# Patient Record
Sex: Male | Born: 1965
Health system: Southern US, Community
[De-identification: ages and names within clinical notes are randomized; demographics above are authoritative.]

## PROBLEM LIST (undated history)

## (undated) DIAGNOSIS — M109 Gout, unspecified: Secondary | ICD-10-CM

## (undated) DIAGNOSIS — I10 Essential (primary) hypertension: Secondary | ICD-10-CM

## (undated) HISTORY — PX: KNEE SURGERY: SHX244

## (undated) HISTORY — DX: Gout, unspecified: M10.9

## (undated) HISTORY — DX: Essential (primary) hypertension: I10

---

## 2018-10-04 ENCOUNTER — Encounter: Payer: Self-pay | Admitting: Family Medicine

## 2018-10-04 ENCOUNTER — Ambulatory Visit (INDEPENDENT_AMBULATORY_CARE_PROVIDER_SITE_OTHER): Payer: Managed Care, Other (non HMO) | Admitting: Family Medicine

## 2018-10-04 ENCOUNTER — Ambulatory Visit: Payer: Self-pay | Admitting: Physician Assistant

## 2018-10-04 VITALS — BP 200/110 | HR 75 | Temp 98.1°F | Ht 71.0 in | Wt 194.4 lb

## 2018-10-04 DIAGNOSIS — Z8249 Family history of ischemic heart disease and other diseases of the circulatory system: Secondary | ICD-10-CM | POA: Insufficient documentation

## 2018-10-04 DIAGNOSIS — I10 Essential (primary) hypertension: Secondary | ICD-10-CM | POA: Diagnosis not present

## 2018-10-04 LAB — TSH: TSH: 1.56 u[IU]/mL (ref 0.35–4.50)

## 2018-10-04 LAB — CBC
HCT: 49.5 % (ref 39.0–52.0)
Hemoglobin: 17 g/dL (ref 13.0–17.0)
MCHC: 34.3 g/dL (ref 30.0–36.0)
MCV: 90.6 fl (ref 78.0–100.0)
Platelets: 208 10*3/uL (ref 150.0–400.0)
RBC: 5.46 Mil/uL (ref 4.22–5.81)
RDW: 12.8 % (ref 11.5–15.5)
WBC: 6.5 10*3/uL (ref 4.0–10.5)

## 2018-10-04 LAB — COMPREHENSIVE METABOLIC PANEL
ALT: 18 U/L (ref 0–53)
AST: 18 U/L (ref 0–37)
Albumin: 4.5 g/dL (ref 3.5–5.2)
Alkaline Phosphatase: 51 U/L (ref 39–117)
BUN: 14 mg/dL (ref 6–23)
CHLORIDE: 101 meq/L (ref 96–112)
CO2: 28 mEq/L (ref 19–32)
Calcium: 9.7 mg/dL (ref 8.4–10.5)
Creatinine, Ser: 1.27 mg/dL (ref 0.40–1.50)
GFR: 62.68 mL/min (ref >60.00)
GLUCOSE: 90 mg/dL (ref 70–99)
Potassium: 4.4 mEq/L (ref 3.5–5.1)
Sodium: 137 mEq/L (ref 135–145)
Total Bilirubin: 1.2 mg/dL (ref 0.2–1.2)
Total Protein: 7.1 g/dL (ref 6.0–8.3)

## 2018-10-04 MED ORDER — AMLODIPINE BESYLATE 10 MG PO TABS: 10.0000 mg | ORAL_TABLET | Freq: Every day | ORAL | 3 refills | Status: DC

## 2018-10-04 NOTE — Patient Instructions (Signed)
It was very nice to see you today!  Please start the amlodipine.  Please take 1 pill once daily.  Please keep an eye on your blood pressure over the next week or so and let me know if it is persistently elevated.  Let me know if you have any side effects.  We will place referral to a cardiologist today.  I would like for you to come back and see us within the next week or so to recheck your blood pressure if you have not seen the cardiologist.  We will check blood work today.  Please seek medical care if you start having any symptoms including chest pain, shortness of breath, headache, blurred vision, weakness, or numbness.  Take care, Dr Jimmey RalphParker   [facility] DASH stands for "Dietary Approaches to Stop Hypertension." The DASH eating plan is a healthy eating plan that has been shown to reduce high blood pressure (hypertension). It may also reduce your risk for type 2 diabetes, heart disease, and stroke. The DASH eating plan may also help with weight loss. What are tips for following this plan?  General guidelines  Avoid eating more than 2,300 mg (milligrams) of salt (sodium) a day. If you have hypertension, you may need to reduce your sodium intake to 1,500 mg a day.  Limit alcohol intake to no more than 1 drink a day for nonpregnant women and 2 drinks a day for men. One drink equals 12 oz of beer, 5 oz of wine, or 1 oz of hard liquor.  Work with your health care provider to maintain a healthy body weight or to lose weight. Ask what an ideal weight is for you.  Get at least 30 minutes of exercise that causes your heart to beat faster (aerobic exercise) most days of the week. Activities may include walking, swimming, or biking.  Work with your health care provider or diet and nutrition specialist (dietitian) to adjust your eating plan to your individual calorie needs. Reading food labels   Check food labels for the amount of sodium per serving. Choose foods with less than 5  percent of the Daily Value of sodium. Generally, foods with less than 300 mg of sodium per serving fit into this eating plan.  To find whole grains, look for the word "whole" as the first word in the ingredient list. Shopping  Buy products labeled as "low-sodium" or "no salt added."  Buy fresh foods. Avoid canned foods and premade or frozen meals. Cooking  Avoid adding salt when cooking. Use salt-free seasonings or herbs instead of table salt or sea salt. Check with your health care provider or pharmacist before using salt substitutes.  Do not fry foods. Cook foods using healthy methods such as baking, boiling, grilling, and broiling instead.  Cook with heart-healthy oils, such as olive, canola, soybean, or sunflower oil. Meal planning  Eat a balanced diet that includes: ? 5 or more servings of fruits and vegetables each day. At each meal, try to fill half of your plate with fruits and vegetables. ? Up to 6-8 servings of whole grains each day. ? Less than 6 oz of lean meat, poultry, or fish each day. A 3-oz serving of meat is about the same size as a deck of cards. One egg equals 1 oz. ? 2 servings of low-fat dairy each day. ? A serving of nuts, seeds, or beans 5 times each week. ? Heart-healthy fats. Healthy fats called Omega-3 fatty acids are found in foods such as  flaxseeds and coldwater fish, like sardines, salmon, and mackerel.  Limit how much you eat of the following: ? Canned or prepackaged foods. ? Food that is high in trans fat, such as fried foods. ? Food that is high in saturated fat, such as fatty meat. ? Sweets, desserts, sugary drinks, and other foods with added sugar. ? Full-fat dairy products.  Do not salt foods before eating.  Try to eat at least 2 vegetarian meals each week.  Eat more home-cooked food and less restaurant, buffet, and fast food.  When eating at a restaurant, ask that your food be prepared with less salt or no salt, if possible. What foods are  recommended? The items listed may not be a complete list. Talk with your dietitian about what dietary choices are best for you. Grains Whole-grain or whole-wheat bread. Whole-grain or whole-wheat pasta. Brown rice. Orpah Cobbatmeal. Quinoa. Bulgur. Whole-grain and low-sodium cereals. Pita bread. Low-fat, low-sodium crackers. Whole-wheat flour tortillas. Vegetables Fresh or frozen vegetables (raw, steamed, roasted, or grilled). Low-sodium or reduced-sodium tomato and vegetable juice. Low-sodium or reduced-sodium tomato sauce and tomato paste. Low-sodium or reduced-sodium canned vegetables. Fruits All fresh, dried, or frozen fruit. Canned fruit in natural juice (without added sugar). Meat and other protein foods Skinless chicken or Malawiturkey. Ground chicken or Mal[place]iturkey. Pork with fat trimmed off. Fish and seafood. Egg whites. Dried beans, peas, or lentils. Unsalted nuts, nut butters, and seeds. Unsalted canned beans. Lean cuts of beef with fat trimmed off. Low-sodium, lean deli meat. Dairy Low-fat (1%) or fat-free (skim) milk. Fat-free, low-fat, or reduced-fat cheeses. Nonfat, low-sodium ricotta or cottage cheese. Low-fat or nonfat yogurt. Low-fat, low-sodium cheese. Fats and oils Soft margarine without trans fats. Vegetable oil. Low-fat, reduced-fat, or light mayonnaise and salad dressings (reduced-sodium). Canola, safflower, olive, soybean, and sunflower oils. Avocado. Seasoning and other foods Herbs. Spices. Seasoning mixes without salt. Unsalted popcorn and pretzels. Fat-free sweets. What foods are not recommended? The items listed may not be a complete list. Talk with your dietitian about what dietary choices are best for you. Grains Baked goods made with fat, such as croissants, muffins, or some breads. Dry pasta or rice meal packs. Vegetables Creamed or fried vegetables. Vegetables in a cheese sauce. Regular canned vegetables (not low-sodium or reduced-sodium). Regular canned tomato sauce and paste (not  low-sodium or reduced-sodium). Regular tomato and vegetable juice (not low-sodium or reduced-sodium). Rosita FirePickles. Olives. Fruits Canned fruit in a light or heavy syrup. Fried fruit. Fruit in cream or butter sauce. Meat and other protein foods Fatty cuts of meat. Ribs. Fried meat. Tomasa BlaseBacon. Sausage. Bologna and other processed lunch meats. Salami. Fatback. Hotdogs. Bratwurst. Salted nuts and seeds. Canned beans with added salt. Canned or smoked fish. Whole eggs or egg yolks. Chicken or Malawiturkey with skin. Dairy Whole or 2% milk, cream, and half-and-half. Whole or full-fat cream cheese. Whole-fat or sweetened yogurt. Full-fat cheese. Nondairy creamers. Whipped toppings. Processed cheese and cheese spreads. Fats and oils Butter. Stick margarine. Lard. Shortening. Ghee. Bacon fat. Tropical oils, such as coconut, palm kernel, or palm oil. Seasoning and other foods Salted popcorn and pretzels. Onion salt, garlic salt, seasoned salt, table salt, and sea salt. Worcestershire sauce. Tartar sauce. Barbecue sauce. Teriyaki sauce. Soy sauce, including reduced-sodium. Steak sauce. Canned and packaged gravies. Fish sauce. Oyster sauce. Cocktail sauce. Horseradish that you find on the shelf. Ketchup. Mustard. Meat flavorings and tenderizers. Bouillon cubes. Hot sauce and Tabasco sauce. Premade or packaged marinades. Premade or packaged taco seasonings. Relishes. Regular salad dressings. Where  to find more information:  National Heart, Lung, and Blood Institute: PopSteam.is  American Heart Association: www.heart.org Summary  The DASH eating plan is a healthy eating plan that has been shown to reduce high blood pressure (hypertension). It may also reduce your risk for type 2 diabetes, heart disease, and stroke.  With the DASH eating plan, you should limit salt (sodium) intake to 2,300 mg a day. If you have hypertension, you may need to reduce your sodium intake to 1,500 mg a day.  When on the DASH eating plan,  aim to eat more fresh fruits and vegetables, whole grains, lean proteins, low-fat dairy, and heart-healthy fats.  Work with your health care provider or diet and nutrition specialist (dietitian) to adjust your eating plan to your individual calorie needs. This information is not intended to replace advice given to you by your health care provider. Make sure you discuss any questions you have with your health care provider. Document Released: 08/26/2011 Document Revised: 08/30/2016 Document Reviewed: 08/30/2016 Elsevier Interactive Patient Education  2019 [employer].

## 2018-10-04 NOTE — Progress Notes (Signed)
ekg 

## 2018-10-04 NOTE — Progress Notes (Signed)
Cardiology Office Note   Date:  10/05/2018   ID:  Lee Henderson, DOB 10-23-1965, MRN [id number]  PCP:  Ardith Dark, MD  Cardiologist:   No primary care provider on file. Referring:  Ardith Dark, MD  No chief complaint on file.     History of Present Illness: Lee Henderson is a 53 y.o. male who is referred by Ardith Dark, MD for evaluation of a strong family history of CAD and new HTN.  The patient has had no recent cardiovascular testing.  He said about 20 years ago he had treadmill testing.  He does however have a family history as described below.  He wanted to get in for primary evaluation and did yesterday and was found to be significantly hypertensive with a blood pressure of 200/110.  He did not know that he had this degree of hypertension.  He was started on Norvasc.  His blood pressure is better today.  He was not having symptoms.  He has been trying to exercise tries to do some hiking with full pack in preparation for a camping trip he is taking with the [employer].  The patient denies any new symptoms such as chest discomfort, neck or arm discomfort.  There have been no reported palpitations, presyncope or syncope.   He does get short of breath with activities but is not describing PND or orthopnea.  Past Medical History:  Diagnosis Date  . Gout   . HTN (hypertension)     Past Surgical History:  Procedure Laterality Date  . KNEE SURGERY       Current Outpatient Medications  Medication Sig Dispense Refill  . amLODipine (NORVASC) 10 MG tablet Take 1 tablet (10 mg total) by mouth daily. 90 tablet 3   No current facility-administered medications for this visit.     Allergies:   Patient has no known allergies.    Social History:  The patient  reports that he has never smoked. He has never used smokeless tobacco. He reports current alcohol use. He reports that he does not use drugs.   Family History:  The patient's family history includes Alcohol abuse in his  paternal grandfather; Breast cancer in his mother; Heart attack in his paternal grandfather and paternal uncle; Heart disease in his paternal uncle; Heart disease (age of onset: 65) in his father; Hypertension in his father.    ROS:  Please see the history of present illness.   Otherwise, review of systems are positive for none.   All other systems are reviewed and negative.    PHYSICAL EXAM: VS:  BP (!) 168/98   Pulse 79   Ht 5\' 11"  (1.803 m)   Wt 193 lb 9.6 oz (87.8 kg)   BMI 27.00 kg/m  , BMI Body mass index is 27 kg/m. GENERAL:  Well appearing HEENT:  Pupils equal round and reactive, fundi not visualized, oral mucosa unremarkable NECK:  No jugular venous distention, waveform within normal limits, carotid upstroke brisk and symmetric, no bruits, no thyromegaly LYMPHATICS:  No cervical, inguinal adenopathy LUNGS:  Clear to auscultation bilaterally BACK:  No CVA tenderness CHEST:  Unremarkable HEART:  PMI not displaced or sustained,S1 and S2 within normal limits, no S3, no S4, no clicks, no rubs, no murmurs ABD:  Flat, positive bowel sounds normal in frequency in pitch, no bruits, no rebound, no guarding, no midline pulsatile mass, no hepatomegaly, no splenomegaly EXT:  2 plus pulses throughout, no edema, no cyanosis no clubbing SKIN:  No rashes no nodules NEURO:  Cranial nerves II through XII grossly intact, motor grossly intact throughout PSYCH:  Cognitively intact, oriented to person place and time    EKG:  EKG is not ordered today. The ekg ordered 10/05/18 demonstrates sinus rhythm, rate 72, axis within normal limits, intervals within normal limits, early repolarization pattern.   Recent Labs: 10/04/2018: ALT 18; BUN 14; Creatinine, Ser 1.27; Hemoglobin 17.0; Platelets 208.0; Potassium 4.4; Sodium 137; TSH 1.56    Lipid Panel No results found for: CHOL, TRIG, HDL, CHOLHDL, VLDL, LDLCALC, LDLDIRECT    Wt Readings from Last 3 Encounters:  10/05/18 193 lb 9.6 oz (87.8 kg)   10/04/18 194 lb 6.1 oz (88.2 kg)      Other studies Reviewed: Additional studies/ records that were reviewed today include: Office records. Review of the above records demonstrates:  Please see elsewhere in the note.     ASSESSMENT AND PLAN:  DOE: The patient has significant cardiovascular risk factors.  There is some dyspnea on exertion.  The pretest probability of obstructive coronary disease is low moderate.  I will bring the patient back for a POET (Plain Old Exercise Test). This will allow me to screen for obstructive coronary disease, risk stratify and very importantly provide a prescription for exercise.  However, I would not be able to do this until his blood pressure little bit better controlled.  He will keep an eye on this and let me know and we can schedule this.  I would also like to screen him with a coronary calcium score.  FAMILY HISTORY OF CAD: We are going to pursue aggressive risk reduction.  He needs a fasting lipid profile when he comes back.  We talked about lifestyle and he employs most of this.  We talked in particular about diet.  HTN: We will keep a blood pressure diary twice a day and I be happy to review these.  I agree with the change in medications.  Further changes will be based on these readings.    Current medicines are reviewed at length with the patient today.  The patient does not have concerns regarding medicines.  The following changes have been made:  no change  Labs/ tests ordered today include:   Orders Placed This Encounter  Procedures  . CT CARDIAC SCORING  . EXERCISE TOLERANCE TEST (ETT)     Disposition:   FU with me as needed based on the results of the above.     Signed, Rollene Rotunda, MD  10/05/2018 12:52 PM    [facility]

## 2018-10-04 NOTE — Assessment & Plan Note (Addendum)
Significantly elevated today however has no signs of endorgan damage.  EKG with normal sinus rhythm and no ischemic changes.  Otherwise asymptomatic.  Given his lack of symptoms and lack of signs of endorgan damage, do not think that he needs to go to the emergency department at this point.  We will start amlodipine 10 mg daily.  He will check his blood pressure over the next several days and let us know if it is not responding appropriately.  He will follow-up with [place] in the next week or so.  Check CBC, CMET, and TSH.  Patient is also very concerned about potential cardiac disease given his strong family history.  Will place referral to cardiology for risk stratification.

## 2018-10-04 NOTE — Progress Notes (Signed)
Subjective:  Lee Henderson is a 53 y.o. male who presents today with a chief complaint of hypertension and to establish care  HPI:  HTN, new problem Patient presents today with elevated blood pressure readings.  Over the last week or so has had a few outside blood pressure readings into the 200s over 100s.  He has taken these at automated kiosks at [employer] and [address].  It has been almost 20 years since he has seen a primary care physician.  He has been to urgent care a few times over the last several years and has never been told that he has high blood pressure in the past.  Denies any symptoms.  No headaches.  No blurred vision.  No weakness or numbness.  No chest pain or shortness of breath.  He has had some dietary indiscretions recently related to the holidays including increased salt intake which she thinks could have possibly contributed.  No medication changes.  He does not take any over-the-counter analgesics.  He has a strong family history of hypertension and ischemic heart disease.  Patient is concerned about possible ischemic heart disease for himself.  He has not tried any home remedies for his blood pressure.  No other obvious alleviating or aggravating factors.  Patient also has a history of diet-controlled gout.  He is not currently on any medications for this.  Has not had a flare in several years.  ROS: Per HPI, otherwise a complete review of systems was negative.   PMH:  The following were reviewed and entered/updated in epic: Past Medical History:  Diagnosis Date  . Gout    Patient Active Problem List   Diagnosis Date Noted  . Hypertension 10/04/2018  . Family history of chronic ischemic heart disease 10/04/2018   History reviewed. No pertinent surgical history.  Family History  Problem Relation Age of Onset  . Breast cancer Mother   . Heart disease Father   . Hypertension Father   . Alcohol abuse Paternal Grandfather   . Heart disease Paternal Uncle      Medications- reviewed and updated Current Outpatient Medications  Medication Sig Dispense Refill  . amLODipine (NORVASC) 10 MG tablet Take 1 tablet (10 mg total) by mouth daily. 90 tablet 3   No current facility-administered medications for this visit.     Allergies-reviewed and updated No Known Allergies  Social History   Socioeconomic History  . Marital status: Married    Spouse name: Not on file  . Number of children: Not on file  . Years of education: Not on file  . Highest education level: Not on file  Occupational History  . Not on file  Social Needs  . Financial resource strain: Not on file  . Food insecurity:    Worry: Not on file    Inability: Not on file  . Transportation needs:    Medical: Not on file    Non-medical: Not on file  Tobacco Use  . Smoking status: Never Smoker  . Smokeless tobacco: Never Used  Substance and Sexual Activity  . Alcohol use: Yes    Comment: Occu  . Drug use: Never  . Sexual activity: Yes  Lifestyle  . Physical activity:    Days per week: Not on file    Minutes per session: Not on file  . Stress: Not on file  Relationships  . Social connections:    Talks on phone: Not on file    Gets together: Not on file  Attends religious service: Not on file    Active member of club or organization: Not on file    Attends meetings of clubs or organizations: Not on file    Relationship status: Not on file  Other Topics Concern  . Not on file  Social History Narrative  . Not on file     Objective:  Physical Exam: BP (!) 200/110 (BP Location: Left Arm, Patient Position: Sitting, Cuff Size: Normal)   Pulse 75   Temp 98.1 F (36.7 C) (Oral)   Ht 5\' 11"  (1.803 m)   Wt 194 lb 6.1 oz (88.2 kg)   SpO2 96%   BMI 27.11 kg/m   Gen: NAD, resting comfortably CV: RRR with no murmurs appreciated Pulm: NWOB, CTAB with no crackles, wheezes, or rhonchi GI: Normal bowel sounds present. Soft, Nontender, Nondistended. MSK: No edema,  cyanosis, or clubbing noted Skin: Warm, dry Neuro: Grossly normal, moves all extremities Psych: Normal affect and thought content  EKG: NSR.  No ischemic changes.  Assessment/Plan:  Hypertension Significantly elevated today however has no signs of endorgan damage.  EKG with normal sinus rhythm and no ischemic changes.  Otherwise asymptomatic.  Given his lack of symptoms and lack of signs of endorgan damage, do not think that he needs to go to the emergency department at this point.  We will start amlodipine 10 mg daily.  He will check his blood pressure over the next several days and let us know if it is not responding appropriately.  He will follow-up with [place] in the next week or so.  Check CBC, CMET, and TSH.  Patient is also very concerned about potential cardiac disease given his strong family history.  Will place referral to cardiology for risk stratification.  [name]. Jimmey Ralph, MD 10/04/2018 12:15 PM

## 2018-10-05 ENCOUNTER — Ambulatory Visit: Payer: Managed Care, Other (non HMO) | Admitting: Cardiology

## 2018-10-05 ENCOUNTER — Ambulatory Visit (INDEPENDENT_AMBULATORY_CARE_PROVIDER_SITE_OTHER)
Admission: RE | Admit: 2018-10-05 | Discharge: 2018-10-05 | Disposition: A | Discharge status: Home / Self Care | Payer: Self-pay | Source: Ambulatory Visit | Attending: Cardiology | Admitting: Cardiology

## 2018-10-05 ENCOUNTER — Encounter: Payer: Self-pay | Admitting: Cardiology

## 2018-10-05 ENCOUNTER — Telehealth: Payer: Self-pay | Admitting: Family Medicine

## 2018-10-05 VITALS — BP 168/98 | HR 79 | Ht 71.0 in | Wt 193.6 lb

## 2018-10-05 DIAGNOSIS — I10 Essential (primary) hypertension: Secondary | ICD-10-CM

## 2018-10-05 DIAGNOSIS — R0602 Shortness of breath: Secondary | ICD-10-CM | POA: Diagnosis not present

## 2018-10-05 NOTE — Telephone Encounter (Signed)
Pt's wife called and patient gave permission to give results to her as DPR is not filed in chart, wife given lab results per notes of Dr. Jimmey RalphParker on 10/05/18, she verbalized understanding. She says they are at the cardiologist office now being checked in. She says if he wasn't able to get into the cardiologist, Dr. Jimmey RalphParker wanted him to come back in for a BP check, so this appointment is not needed.

## 2018-10-05 NOTE — Progress Notes (Signed)
Attempted to notify patient not able to leave a message due to no voicemail. CRM Created.

## 2018-10-05 NOTE — Progress Notes (Signed)
Please inform patient of the following:  His blood work is all normal. Do not need to make any changes to his treatment plan at this time. Would like for him to be seen in the next week or so for BP recheck.  [name]. Jimmey Ralph, MD 10/05/2018 10:40 AM

## 2018-10-05 NOTE — Telephone Encounter (Signed)
Noted  

## 2018-10-05 NOTE — Patient Instructions (Addendum)
Medication Instructions:  Continue current medications  If you need a refill on your cardiac medications before your next appointment, please call your pharmacy.  Labwork: None Ordered   Take the provided lab slips with you to the lab for your blood draw.  When you have your labs (blood work) drawn today and your tests are completely normal, you will receive your results only by MyChart Message (if you have MyChart) -OR-  A paper copy in the mail.  If you have any lab test that is abnormal or we need to change your treatment, we will call you to review these results.  Testing/Procedures: Your physician has requested that you have a Coronary Calcium Score. This test is done at our [employer] physician has requested that you have an exercise tolerance test. For further information please visit [email or web address] Please also follow instruction sheet, as given.  Follow-Up: . Your physician recommends that you schedule a follow-up appointment in: As Needed   At [facility], you and your health needs are our priority.  As part of our continuing mission to provide you with exceptional heart care, we have created designated Provider Care Teams.  These Care Teams include your primary Cardiologist (physician) and Advanced Practice Providers (APPs -  Physician Assistants and Nurse Practitioners) who all work together to provide you with the care you need, when you need it.  Thank you for choosing [facility] at [facility]!!

## 2018-10-05 NOTE — Telephone Encounter (Signed)
See note

## 2018-10-16 ENCOUNTER — Encounter: Payer: Self-pay | Admitting: Cardiology

## 2018-10-16 ENCOUNTER — Telehealth: Payer: Self-pay | Admitting: *Deleted

## 2018-10-16 NOTE — Telephone Encounter (Signed)
-----   Message from Rollene Rotunda, MD sent at 10/05/2018 11:03 PM EST ----- Please call him and tell him that he had no coronary calcium.  His ascending aorta is mildly enlarged and is not an issue at this time.  It is something that I will follow over time.  I will see him back in about two months and talk about following this with a CT in the future.  Again it is not a concern at this point.  If he has questions I will be able to talk to him directly next week if he would like.  Send results to Ardith Dark, MD

## 2018-10-16 NOTE — Telephone Encounter (Signed)
gxt ordered and send to scheduler

## 2018-10-19 ENCOUNTER — Telehealth (HOSPITAL_COMMUNITY): Payer: Self-pay

## 2018-10-19 NOTE — Telephone Encounter (Signed)
Unable to reach patient. Encounter complete. 

## 2018-10-20 ENCOUNTER — Telehealth (HOSPITAL_COMMUNITY): Payer: Self-pay

## 2018-10-20 ENCOUNTER — Encounter

## 2018-10-20 NOTE — Telephone Encounter (Signed)
Encounter complete. 

## 2018-10-24 ENCOUNTER — Ambulatory Visit (HOSPITAL_COMMUNITY)
Admission: RE | Admit: 2018-10-24 | Discharge: 2018-10-24 | Disposition: A | Discharge status: Home / Self Care | Payer: Managed Care, Other (non HMO) | Source: Ambulatory Visit | Attending: Cardiology | Admitting: Cardiology

## 2018-10-24 DIAGNOSIS — R0602 Shortness of breath: Secondary | ICD-10-CM | POA: Diagnosis present

## 2018-10-24 LAB — EXERCISE TOLERANCE TEST
CSEPHR: 90 %
Estimated workload: 15.3 METS
Exercise duration (min): 13 min
Exercise duration (sec): 1 s
MPHR: 166 {beats}/min
Peak HR: 150 {beats}/min
RPE: 19
Rest HR: 67 {beats}/min

## 2018-10-27 ENCOUNTER — Telehealth: Payer: Self-pay | Admitting: Cardiology

## 2018-10-27 NOTE — Telephone Encounter (Signed)
Spoke with pt, preliminary results given. Aware waiting for dr hochrein to review.

## 2018-10-27 NOTE — Telephone Encounter (Signed)
New Message   Patients wife is calling on behalf of spouse to obtain ETT results. Please call.

## 2018-11-01 ENCOUNTER — Ambulatory Visit: Payer: Self-pay | Admitting: Physician Assistant

## 2018-11-26 NOTE — Progress Notes (Signed)
Cardiology Office Note   Date:  11/28/2018   ID:  Lee Henderson, DOB 1966/08/24, MRN [id number]  PCP:  Ardith Dark, MD  Cardiologist:   Rollene Rotunda, MD   Chief Complaint  Patient presents with  . Hypertension  . Shortness of Breath      History of Present Illness: Lee Henderson is a 53 y.o. male who is referred by Ardith Dark, MD for evaluation of a strong family history of CAD and new HTN.  Last month he had a negative POET (Plain Old Exercise Treadmill).  The patient had a calcium score of 0.  He is done well since I last saw him.  However, his blood pressures have not been controlled yet.  His systolics are in the 160s with diastolics sometimes in the 90s.  He denies any chest pressure, neck or arm discomfort.  He is watching his diet and reducing salt.  He lost some weight.  He is physically active.  He denies any shortness of breath, PND or orthopnea.  He said no palpitations, presyncope or syncope.  He said no weight gain or edema.   Past Medical History:  Diagnosis Date  . Gout   . HTN (hypertension)     Past Surgical History:  Procedure Laterality Date  . KNEE SURGERY       Current Outpatient Medications  Medication Sig Dispense Refill  . amLODipine (NORVASC) 10 MG tablet Take 1 tablet (10 mg total) by mouth daily. 90 tablet 3  . chlorthalidone (HYGROTON) 25 MG tablet Take 1 tablet (25 mg total) by mouth daily. 90 tablet 3   No current facility-administered medications for this visit.     Allergies:   Patient has no known allergies.    ROS:  Please see the history of present illness.   Otherwise, review of systems are positive for none.   All other systems are reviewed and negative.    PHYSICAL EXAM: VS:  BP 138/90 (BP Location: Right Arm, Patient Position: Sitting, Cuff Size: Normal)   Pulse 78   Ht 5\' 11"  (1.803 m)   Wt 184 lb (83.5 kg)   BMI 25.66 kg/m  , BMI Body mass index is 25.66 kg/m. GENERAL:  Well appearing NECK:  No jugular venous  distention, waveform within normal limits, carotid upstroke brisk and symmetric, no bruits, no thyromegaly LUNGS:  Clear to auscultation bilaterally CHEST:  Unremarkable HEART:  PMI not displaced or sustained,S1 and S2 within normal limits, no S3, no S4, no clicks, no rubs, no murmurs ABD:  Flat, positive bowel sounds normal in frequency in pitch, no bruits, no rebound, no guarding, no midline pulsatile mass, no hepatomegaly, no splenomegaly EXT:  2 plus pulses throughout, no edema, no cyanosis no clubbing   EKG:  EKG is not ordered today.   Recent Labs: 10/04/2018: ALT 18; BUN 14; Creatinine, Ser 1.27; Hemoglobin 17.0; Platelets 208.0; Potassium 4.4; Sodium 137; TSH 1.56    Lipid Panel No results found for: CHOL, TRIG, HDL, CHOLHDL, VLDL, LDLCALC, LDLDIRECT    Wt Readings from Last 3 Encounters:  11/28/18 184 lb (83.5 kg)  10/05/18 193 lb 9.6 oz (87.8 kg)  10/04/18 194 lb 6.1 oz (88.2 kg)      Other studies Reviewed: Additional studies/ records that were reviewed today include: BP diary Review of the above records demonstrates:      ASSESSMENT AND PLAN:  DOE: He has negative coronary calcium.  He had a negative treadmill test.  No change  in therapy is indicated.  He will continue with risk reduction.  HTN:   His blood pressure is not yet at target.  I am getting a chlorthalidone 25 mg daily.  He will keep a blood pressure diary.  Further adjustments will be based on these readings.  ASCENDING AORTIC DILATATION: This was noted on his CT.  I will follow-up with a CTA probably in 2 years.  He and I discussed this so this can be on his calendar.    Current medicines are reviewed at length with the patient today.  The patient does not have concerns regarding medicines.  The following changes have been made:  As above  Labs/ tests ordered today include: None  No orders of the defined types were placed in this encounter.    Disposition:   FU with me in six months.     Signed, Rollene Rotunda, MD  11/28/2018 3:29 PM    [facility]

## 2018-11-28 ENCOUNTER — Ambulatory Visit (INDEPENDENT_AMBULATORY_CARE_PROVIDER_SITE_OTHER): Payer: BLUE CROSS/BLUE SHIELD | Admitting: Cardiology

## 2018-11-28 ENCOUNTER — Encounter: Payer: Self-pay | Admitting: Cardiology

## 2018-11-28 VITALS — BP 138/90 | HR 78 | Ht 71.0 in | Wt 184.0 lb

## 2018-11-28 DIAGNOSIS — I712 Thoracic aortic aneurysm, without rupture: Secondary | ICD-10-CM | POA: Diagnosis not present

## 2018-11-28 DIAGNOSIS — I10 Essential (primary) hypertension: Secondary | ICD-10-CM | POA: Diagnosis not present

## 2018-11-28 DIAGNOSIS — R0602 Shortness of breath: Secondary | ICD-10-CM | POA: Insufficient documentation

## 2018-11-28 DIAGNOSIS — I7121 Aneurysm of the ascending aorta, without rupture: Secondary | ICD-10-CM

## 2018-11-28 MED ORDER — CHLORTHALIDONE 25 MG PO TABS: 25.0000 mg | ORAL_TABLET | Freq: Every day | ORAL | 3 refills | Status: DC

## 2018-11-28 NOTE — Patient Instructions (Signed)
Medication Instructions:  Dr Antoine Poche has recommended making the following medication changes: 1. START Chlorthalidone 25 mg - take 1 tablet by mouth daily.  If you need a refill on your cardiac medications before your next appointment, please call your pharmacy.   Your physician has requested that you regularly monitor your blood pressure at home. Please report your readings back to Dr Antoine Poche. You may use our online patient portal 'MyChart' or you can call the office to speak with a nurse.  Follow-Up: At [facility], you and your health needs are our priority.  As part of our continuing mission to provide you with exceptional heart care, we have created designated Provider Care Teams.  These Care Teams include your primary Cardiologist (physician) and Advanced Practice Providers (APPs -  Physician Assistants and Nurse Practitioners) who all work together to provide you with the care you need, when you need it. You will need a follow up appointment in 6 months.  Please call our office 2 months in advance to schedule this appointment.  You may see Rollene Rotunda, MD or one of the following Advanced Practice Providers on your designated Care Team:   Theodore Demark, PA-C . Joni Reining, DNP, ANP . You will receive a reminder letter in the mail two months in advance. If you don't receive a letter, please call our office to schedule the follow-up appointment.

## 2019-07-08 DIAGNOSIS — I77819 Aortic ectasia, unspecified site: Secondary | ICD-10-CM | POA: Insufficient documentation

## 2019-07-08 NOTE — Progress Notes (Signed)
Cardiology Office Note   Date:  07/09/2019   ID:  Lee Henderson, DOB Oct 05, 1965, MRN [id number]  PCP:  Lee Barrack, MD  Cardiologist:   Lee Breeding, MD   Chief Complaint  Patient presents with  . DOE      History of Present Illness: Lee Henderson is a 53 y.o. male who is referred by Lee Barrack, MD for evaluation of a strong family history of CAD and new HTN.  In Feb he had a negative POET (Plain Old Exercise Treadmill).  The patient had a calcium score of 0.   Since I last saw him he has lost 50 pounds through diet and exercise!  He feels well. The patient denies any new symptoms such as chest discomfort, neck or arm discomfort. There has been no new shortness of breath, PND or orthopnea. There have been no reported palpitations, presyncope or syncope.  Past Medical History:  Diagnosis Date  . Gout   . HTN (hypertension)     Past Surgical History:  Procedure Laterality Date  . KNEE SURGERY       Current Outpatient Medications  Medication Sig Dispense Refill  . amLODipine (NORVASC) 10 MG tablet Take 10 mg by mouth as directed. TAKE 1/2 TABLET BY MOUTH DAILY    . chlorthalidone (HYGROTON) 25 MG tablet Take 1 tablet (25 mg total) by mouth daily. 90 tablet 3   No current facility-administered medications for this visit.     Allergies:   Patient has no known allergies.    ROS:  Please see the history of present illness.   Otherwise, review of systems are positive for none.   All other systems are reviewed and negative.    PHYSICAL EXAM: VS:  BP 100/60   Pulse 61   Temp (!) 97.2 F (36.2 C) (Temporal)   Ht 5\' 11"  (1.803 m)   Wt 151 lb 12.8 oz (68.9 kg)   SpO2 98%   BMI 21.17 kg/m  , BMI Body mass index is 21.17 kg/m. GENERAL:  Well appearing NECK:  No jugular venous distention, waveform within normal limits, carotid upstroke brisk and symmetric, no bruits, no thyromegaly LUNGS:  Clear to auscultation bilaterally CHEST:  Unremarkable HEART:  PMI not  displaced or sustained,S1 and S2 within normal limits, no S3, no S4, no clicks, no rubs, no murmurs ABD:  Flat, positive bowel sounds normal in frequency in pitch, no bruits, no rebound, no guarding, no midline pulsatile mass, no hepatomegaly, no splenomegaly EXT:  2 plus pulses throughout, no edema, no cyanosis no clubbing   EKG:  EKG is not ordered today. NA  Recent Labs: 10/04/2018: ALT 18; BUN 14; Creatinine, Ser 1.27; Hemoglobin 17.0; Platelets 208.0; Potassium 4.4; Sodium 137; TSH 1.56    Lipid Panel No results found for: CHOL, TRIG, HDL, CHOLHDL, VLDL, LDLCALC, LDLDIRECT    Wt Readings from Last 3 Encounters:  07/09/19 151 lb 12.8 oz (68.9 kg)  11/28/18 184 lb (83.5 kg)  10/05/18 193 lb 9.6 oz (87.8 kg)      Other studies Reviewed: Additional studies/ records that were reviewed today include:  None Review of the above records demonstrates:   NA   ASSESSMENT AND PLAN:  DOE: He has negative coronary calcium.  He had a negative treadmill test.  He feels great since losing weight.  He is to go to [place] this year with the scallops.  No further evaluation.   HTN:   His blood pressure is creeping down.  I am going to reduce his Norvasc to 5 mg daily we will see how he does with this.   ASCENDING AORTIC DILATATION: This was noted on his CT.  I will follow-up with a CTA probably in January 2022.   Current medicines are reviewed at length with the patient today.  The patient does not have concerns regarding medicines.  The following changes have been made:  As above  Labs/ tests ordered today include: NA  No orders of the defined types were placed in this encounter.    Disposition:   FU with me in 12  months.    Signed, Lee Rotunda, MD  07/09/2019 4:34 PM    [facility]

## 2019-07-09 ENCOUNTER — Encounter: Payer: Self-pay | Admitting: Cardiology

## 2019-07-09 ENCOUNTER — Other Ambulatory Visit: Payer: Self-pay

## 2019-07-09 ENCOUNTER — Ambulatory Visit (INDEPENDENT_AMBULATORY_CARE_PROVIDER_SITE_OTHER): Payer: BC Managed Care – PPO | Admitting: Cardiology

## 2019-07-09 VITALS — BP 100/60 | HR 61 | Temp 97.2°F | Ht 71.0 in | Wt 151.8 lb

## 2019-07-09 DIAGNOSIS — I77819 Aortic ectasia, unspecified site: Secondary | ICD-10-CM

## 2019-07-09 DIAGNOSIS — R0602 Shortness of breath: Secondary | ICD-10-CM | POA: Diagnosis not present

## 2019-07-09 DIAGNOSIS — I10 Essential (primary) hypertension: Secondary | ICD-10-CM | POA: Diagnosis not present

## 2019-07-09 NOTE — Patient Instructions (Addendum)
Medication Instructions:  DECREASE YOUR AMLODIPINE TO 5 MG DAILY   *If you need a refill on your cardiac medications before your next appointment, please call your pharmacy*  Lab Work: NONE  Testing/Procedures: CT IN January 2022  Follow-Up: At [facility], you and your health needs are our priority.  As part of our continuing mission to provide you with exceptional heart care, we have created designated Provider Care Teams.  These Care Teams include your primary Cardiologist (physician) and Advanced Practice Providers (APPs -  Physician Assistants and Nurse Practitioners) who all work together to provide you with the care you need, when you need it.  Your next appointment:   12 months You will receive a reminder letter in the mail two months in advance. If you don't receive a letter, please call our office to schedule the follow-up appointment.   The format for your next appointment:   In Person  Provider:   You may see Minus Breeding, MD or one of the following Advanced Practice Providers on your designated Care Team:    Rosaria Ferries, PA-C  Jory Sims, DNP, ANP  Cadence Kathlen Mody, NP

## 2019-10-26 MED ORDER — AMLODIPINE BESYLATE 5 MG PO TABS: 5.0000 mg | ORAL_TABLET | ORAL | 3 refills | Status: DC

## 2019-12-28 ENCOUNTER — Ambulatory Visit: Payer: BC Managed Care – PPO | Attending: Internal Medicine

## 2019-12-28 DIAGNOSIS — Z23 Encounter for immunization: Secondary | ICD-10-CM

## 2019-12-28 NOTE — Progress Notes (Signed)
   Covid-19 Vaccination Clinic  Name:  Lee Henderson    MRN: [id number] DOB: 03-05-1966  12/28/2019  Mr. Hedman was observed post Covid-19 immunization for 15 minutes without incident. He was provided with Vaccine Information Sheet and instruction to access the V-Safe system.   Mr. Goetzke was instructed to call 911 with any severe reactions post vaccine: Marland Kitchen Difficulty breathing  . Swelling of face and throat  . A fast heartbeat  . A bad rash all over body  . Dizziness and weakness   Immunizations Administered    Name Date Dose VIS Date Route   Pfizer COVID-19 Vaccine 12/28/2019 12:03 PM 0.3 mL 08/31/2019 Intramuscular   Manufacturer: [employer], [employer]   Lot: [id number]   NDC: [address]-9

## 2020-01-15 ENCOUNTER — Encounter: Payer: Self-pay | Admitting: Family Medicine

## 2020-01-15 ENCOUNTER — Other Ambulatory Visit: Payer: Self-pay

## 2020-01-15 ENCOUNTER — Ambulatory Visit (INDEPENDENT_AMBULATORY_CARE_PROVIDER_SITE_OTHER): Payer: BC Managed Care – PPO | Admitting: Family Medicine

## 2020-01-15 VITALS — BP 124/76 | HR 60 | Temp 98.2°F | Ht 71.0 in | Wt 143.4 lb

## 2020-01-15 DIAGNOSIS — Z0001 Encounter for general adult medical examination with abnormal findings: Secondary | ICD-10-CM | POA: Diagnosis not present

## 2020-01-15 DIAGNOSIS — I10 Essential (primary) hypertension: Secondary | ICD-10-CM | POA: Diagnosis not present

## 2020-01-15 NOTE — Progress Notes (Signed)
Chief Complaint:  Lee Henderson is a 54 y.o. male who presents today for his annual comprehensive physical exam.    Assessment/Plan:  Chronic Problems Addressed Today: No problem-specific Assessment & Plan notes found for this encounter.  Preventative Healthcare: Patient due for colonoscopy and blood work.  He deferred for today.  He would like to discuss again next year.  Patient Counseling(The following topics were reviewed and/or handout was given):  -Nutrition: Stressed importance of moderation in sodium/caffeine intake, saturated fat and cholesterol, caloric balance, sufficient intake of fresh fruits, vegetables, and fiber.  -Stressed the importance of regular exercise.   -Substance Abuse: Discussed cessation/primary prevention of tobacco, alcohol, or other drug use; driving or other dangerous activities under the influence; availability of treatment for abuse.   -Injury prevention: Discussed safety belts, safety helmets, smoke detector, smoking near bedding or upholstery.   -Sexuality: Discussed sexually transmitted diseases, partner selection, use of condoms, avoidance of unintended pregnancy and contraceptive alternatives.   -Dental health: Discussed importance of regular tooth brushing, flossing, and dental visits.  -Health maintenance and immunizations reviewed. Please refer to Health maintenance section.  Return to care in 1 year for next preventative visit.     Subjective:  HPI:  He has no acute complaints today.   Lifestyle Diet: Balanced. Lots of fruits and vegetables.  Exercise: Likes to ride bikes.   Depression screen PHQ 2/9 01/15/2020  Decreased Interest 0  Down, Depressed, Hopeless 0  PHQ - 2 Score 0   Health Maintenance Due  Topic Date Due  . Hepatitis C Screening  Never done  . HIV Screening  Never done  . TETANUS/TDAP  Never done  . COLONOSCOPY  Never done  . COVID-19 Vaccine (2 - Pfizer 2-dose series) 01/18/2020     ROS: Per HPI, otherwise a  complete review of systems was negative.   PMH:  The following were reviewed and entered/updated in epic: Past Medical History:  Diagnosis Date  . Gout   . HTN (hypertension)    Patient Active Problem List   Diagnosis Date Noted  . Acquired dilation of ascending aorta and aortic root ([place]) 07/08/2019  . SOB (shortness of breath) 11/28/2018  . Ascending aortic aneurysm ([place]) 11/28/2018  . Hypertension 10/04/2018  . Family history of chronic ischemic heart disease 10/04/2018   Past Surgical History:  Procedure Laterality Date  . KNEE SURGERY      Family History  Problem Relation Age of Onset  . Breast cancer Mother   . Heart disease Father 41       Stents, CABG  . Hypertension Father   . Alcohol abuse Paternal Grandfather   . Heart attack Paternal Grandfather   . Heart disease Paternal Uncle   . Heart attack Paternal Uncle     Medications- reviewed and updated Current Outpatient Medications  Medication Sig Dispense Refill  . amLODipine (NORVASC) 5 MG tablet Take 1 tablet (5 mg total) by mouth as directed. 90 tablet 3  . chlorthalidone (HYGROTON) 25 MG tablet Take 1 tablet (25 mg total) by mouth daily. 90 tablet 3   No current facility-administered medications for this visit.    Allergies-reviewed and updated No Known Allergies  Social History   Socioeconomic History  . Marital status: Married    Spouse name: Not on file  . Number of children: Not on file  . Years of education: Not on file  . Highest education level: Not on file  Occupational History  . Not on file  Tobacco  Use  . Smoking status: Never Smoker  . Smokeless tobacco: Never Used  Substance and Sexual Activity  . Alcohol use: Yes    Comment: Occu  . Drug use: Never  . Sexual activity: Yes  Other Topics Concern  . Not on file  Social History Narrative   Married, three children.     Social Determinants of Health   Financial Resource Strain:   . Difficulty of Paying Living Expenses:     Food Insecurity:   . Worried About Programme researcher, broadcasting/film/video in the Last Year:   . Barista in the Last Year:   Transportation Needs:   . Freight forwarder (Medical):   Marland Kitchen Lack of Transportation (Non-Medical):   Physical Activity:   . Days of Exercise per Week:   . Minutes of Exercise per Session:   Stress:   . Feeling of Stress :   Social Connections:   . Frequency of Communication with Friends and Family:   . Frequency of Social Gatherings with Friends and Family:   . Attends Religious Services:   . Active Member of Clubs or Organizations:   . Attends Banker Meetings:   Marland Kitchen Marital Status:         Objective:  Physical Exam: BP 124/76   Pulse 60   Temp 98.2 F (36.8 C)   Ht 5\' 11"  (1.803 m)   Wt 143 lb 6.1 oz (65 kg)   SpO2 97%   BMI 20.00 kg/m   Body mass index is 20 kg/m. Wt Readings from Last 3 Encounters:  01/15/20 143 lb 6.1 oz (65 kg)  07/09/19 151 lb 12.8 oz (68.9 kg)  11/28/18 184 lb (83.5 kg)   Gen: NAD, resting comfortably HEENT: TMs normal bilaterally. OP clear. No thyromegaly noted.  CV: RRR with no murmurs appreciated Pulm: NWOB, CTAB with no crackles, wheezes, or rhonchi GI: Normal bowel sounds present. Soft, Nontender, Nondistended. MSK: no edema, cyanosis, or clubbing noted Skin: warm, dry Neuro: CN2-12 grossly intact. Strength 5/5 in upper and lower extremities. Reflexes symmetric and intact bilaterally.  Psych: Normal affect and thought content     Lee Henderson M. 01/28/19, MD 01/15/2020 1:35 PM

## 2020-01-15 NOTE — Assessment & Plan Note (Signed)
Doing much better.  Has lost about 45 pounds in the last year.  Congratulated patient on hard work.  We will continue amlodipine 5 mg daily and chlorthalidone 25 mg daily per cardiology.  Advised him to follow-up with him soon.

## 2020-01-15 NOTE — Patient Instructions (Signed)
It was very nice to see you today!  Keep up the good work.  We will see back in year for your next physical.  Please, come back to see [place] sooner if needed.  Take care, Dr Jerline Pain  Please try these tips to maintain a healthy lifestyle:   Eat at least 3 REAL meals and 1-2 snacks per day.  Aim for no more than 5 hours between eating.  If you eat breakfast, please do so within one hour of getting up.    Each meal should contain half fruits/vegetables, one quarter protein, and one quarter carbs (no bigger than a computer mouse)   Cut down on sweet beverages. This includes juice, soda, and sweet tea.     Drink at least 1 glass of water with each meal and aim for at least 8 glasses per day   Exercise at least 150 minutes every week.    Preventive Care 80-30 Years Old, Male Preventive care refers to lifestyle choices and visits with your health care provider that can promote health and wellness. This includes:  A yearly physical exam. This is also called an annual well check.  Regular dental and eye exams.  Immunizations.  Screening for certain conditions.  Healthy lifestyle choices, such as eating a healthy diet, getting regular exercise, not using drugs or products that contain nicotine and tobacco, and limiting alcohol use. What can I expect for my preventive care visit? Physical exam Your health care provider will check:  Height and weight. These may be used to calculate body mass index (BMI), which is a measurement that tells if you are at a healthy weight.  Heart rate and blood pressure.  Your skin for abnormal spots. Counseling Your health care provider may ask you questions about:  Alcohol, tobacco, and drug use.  Emotional well-being.  Home and relationship well-being.  Sexual activity.  Eating habits.  Work and work Statistician. What immunizations do I need?  Influenza (flu) vaccine  This is recommended every year. Tetanus, diphtheria, and  pertussis (Tdap) vaccine  You may need a Td booster every 10 years. Varicella (chickenpox) vaccine  You may need this vaccine if you have not already been vaccinated. Zoster (shingles) vaccine  You may need this after age 16. Measles, mumps, and rubella (MMR) vaccine  You may need at least one dose of MMR if you were born in 1957 or later. You may also need a second dose. Pneumococcal conjugate (PCV13) vaccine  You may need this if you have certain conditions and were not previously vaccinated. Pneumococcal polysaccharide (PPSV23) vaccine  You may need one or two doses if you smoke cigarettes or if you have certain conditions. Meningococcal conjugate (MenACWY) vaccine  You may need this if you have certain conditions. Hepatitis A vaccine  You may need this if you have certain conditions or if you travel or work in places where you may be exposed to hepatitis A. Hepatitis B vaccine  You may need this if you have certain conditions or if you travel or work in places where you may be exposed to hepatitis B. Haemophilus influenzae type b (Hib) vaccine  You may need this if you have certain risk factors. Human papillomavirus (HPV) vaccine  If recommended by your health care provider, you may need three doses over 6 months. You may receive vaccines as individual doses or as more than one vaccine together in one shot (combination vaccines). Talk with your health care provider about the risks and benefits of  combination vaccines. What tests do I need? Blood tests  Lipid and cholesterol levels. These may be checked every 5 years, or more frequently if you are over 50 years old.  Hepatitis C test.  Hepatitis B test. Screening  Lung cancer screening. You may have this screening every year starting at age 55 if you have a 30-pack-year history of smoking and currently smoke or have quit within the past 15 years.  Prostate cancer screening. Recommendations will vary depending on your  family history and other risks.  Colorectal cancer screening. All adults should have this screening starting at age 50 and continuing until age 75. Your health care provider may recommend screening at age 45 if you are at increased risk. You will have tests every 1-10 years, depending on your results and the type of screening test.  Diabetes screening. This is done by checking your blood sugar (glucose) after you have not eaten for a while (fasting). You may have this done every 1-3 years.  Sexually transmitted disease (STD) testing. Follow these instructions at home: Eating and drinking  Eat a diet that includes fresh fruits and vegetables, whole grains, lean protein, and low-fat dairy products.  Take vitamin and mineral supplements as recommended by your health care provider.  Do not drink alcohol if your health care provider tells you not to drink.  If you drink alcohol: ? Limit how much you have to 0-2 drinks a day. ? Be aware of how much alcohol is in your drink. In the [place] one drink equals one 12 oz bottle of beer (355 mL), one 5 oz glass of wine (148 mL), or one 1 oz glass of hard liquor (44 mL). Lifestyle  Take daily care of your teeth and gums.  Stay active. Exercise for at least 30 minutes on 5 or more days each week.  Do not use any products that contain nicotine or tobacco, such as cigarettes, e-cigarettes, and chewing tobacco. If you need help quitting, ask your health care provider.  If you are sexually active, practice safe sex. Use a condom or other form of protection to prevent STIs (sexually transmitted infections).  Talk with your health care provider about taking a low-dose aspirin every day starting at age 50. What's next?  Go to your health care provider once a year for a well check visit.  Ask your health care provider how often you should have your eyes and teeth checked.  Stay up to date on all vaccines. This information is not intended to replace  advice given to you by your health care provider. Make sure you discuss any questions you have with your health care provider. Document Revised: 08/31/2018 Document Reviewed: 08/31/2018 Elsevier Patient Education  2020 Elsevier Inc.  

## 2020-01-22 ENCOUNTER — Ambulatory Visit: Payer: BC Managed Care – PPO | Attending: Internal Medicine

## 2020-01-22 DIAGNOSIS — Z23 Encounter for immunization: Secondary | ICD-10-CM

## 2020-01-22 NOTE — Progress Notes (Signed)
   Covid-19 Vaccination Clinic  Name:  Lee Henderson    MRN: [id number] DOB: May 21, 1966  01/22/2020  [name] was observed post Covid-19 immunization for 15 minutes without incident. He was provided with Vaccine Information Sheet and instruction to access the V-Safe system.   [name] was instructed to call 911 with any severe reactions post vaccine: Marland Kitchen Difficulty breathing  . Swelling of face and throat  . A fast heartbeat  . A bad rash all over body  . Dizziness and weakness   Immunizations Administered    Name Date Dose VIS Date Route   Pfizer COVID-19 Vaccine 01/22/2020 11:49 AM 0.3 mL 11/14/2018 Intramuscular   Manufacturer: [employer], [employer]   Lot: [id number]   NDC: [address]-8

## 2020-01-30 MED ORDER — CHLORTHALIDONE 25 MG PO TABS: 25.0000 mg | ORAL_TABLET | Freq: Every day | ORAL | 3 refills | Status: DC

## 2020-02-04 ENCOUNTER — Encounter: Payer: Self-pay | Admitting: Family Medicine

## 2020-02-04 NOTE — Telephone Encounter (Signed)
Please Advise

## 2020-02-05 ENCOUNTER — Other Ambulatory Visit: Payer: Self-pay

## 2020-02-05 MED ORDER — EPINEPHRINE 0.3 MG/0.3ML IJ SOAJ: 0.3000 mg | INTRAMUSCULAR | 3 refills | Status: AC | PRN

## 2020-02-05 NOTE — Telephone Encounter (Signed)
Sent to pharmacy 

## 2020-02-25 ENCOUNTER — Other Ambulatory Visit: Payer: Self-pay

## 2020-02-25 MED ORDER — AMLODIPINE BESYLATE 5 MG PO TABS: 5.0000 mg | ORAL_TABLET | ORAL | 3 refills | Status: DC

## 2020-07-17 MED ORDER — AMLODIPINE BESYLATE 5 MG PO TABS: 5.0000 mg | ORAL_TABLET | Freq: Every day | ORAL | 0 refills | Status: DC

## 2020-07-17 MED ORDER — CHLORTHALIDONE 25 MG PO TABS: 25.0000 mg | ORAL_TABLET | Freq: Every day | ORAL | 0 refills | Status: DC

## 2020-08-30 NOTE — Progress Notes (Signed)
Cardiology Office Note   Date:  09/01/2020   ID:  Lee Henderson, DOB 1966-03-27, MRN [id number]  PCP:  Ardith Dark, MD  Cardiologist:   Rollene Rotunda, MD   Chief Complaint  Patient presents with  . Aortic Enlargement      History of Present Illness: Lee Henderson is a 54 y.o. male who is referred by Ardith Dark, MD for evaluation of a strong family history of CAD and new HTN.  In Feb he had a negative POET (Plain Old Exercise Treadmill).  The patient had a calcium score of 0.   He has lost 50 pounds through diet and exercise!    Since I last saw him he went to the [place] and he had a little bit of a problem with his blood pressure but he was able to go hiking and carry pack and go to 12,000 feet. The patient denies any new symptoms such as chest discomfort, neck or arm discomfort. There has been no new shortness of breath, PND or orthopnea. There have been no reported palpitations, presyncope or syncope.   Past Medical History:  Diagnosis Date  . Gout   . HTN (hypertension)     Past Surgical History:  Procedure Laterality Date  . KNEE SURGERY       Current Outpatient Medications  Medication Sig Dispense Refill  . amLODipine (NORVASC) 5 MG tablet Take 1 tablet (5 mg total) by mouth daily. 90 tablet 0  . chlorthalidone (HYGROTON) 25 MG tablet Take 1 tablet (25 mg total) by mouth daily. 90 tablet 0  . EPINEPHrine 0.3 mg/0.3 mL IJ SOAJ injection Inject 0.3 mLs (0.3 mg total) into the muscle as needed. 1 each 3   No current facility-administered medications for this visit.    Allergies:   Patient has no known allergies.    ROS:  Please see the history of present illness.   Otherwise, review of systems are positive for none.   All other systems are reviewed and negative.    PHYSICAL EXAM: VS:  BP 130/82 (BP Location: Left Arm, Patient Position: Sitting)   Pulse (!) 58   Resp 14   Ht 5\' 11"  (1.803 m)   Wt 143 lb 12.8 oz (65.2 kg)   SpO2 96%   BMI 20.06  kg/m  , BMI Body mass index is 20.06 kg/m. GENERAL:  Well appearing NECK:  No jugular venous distention, waveform within normal limits, carotid upstroke brisk and symmetric, no bruits, no thyromegaly LUNGS:  Clear to auscultation bilaterally CHEST:  Unremarkable HEART:  PMI not displaced or sustained,S1 and S2 within normal limits, no S3, no S4, no clicks, no rubs, no murmurs ABD:  Flat, positive bowel sounds normal in frequency in pitch, no bruits, no rebound, no guarding, no midline pulsatile mass, no hepatomegaly, no splenomegaly EXT:  2 plus pulses throughout, no edema, no cyanosis no clubbing   EKG:  EKG is ordered today. Normal sinus rhythm, rate 52, axis within normal limits, intervals within normal limits, early repolarization pattern.  Recent Labs: No results found for requested labs within last 8760 hours.    Lipid Panel No results found for: CHOL, TRIG, HDL, CHOLHDL, VLDL, LDLCALC, LDLDIRECT    Wt Readings from Last 3 Encounters:  09/01/20 143 lb 12.8 oz (65.2 kg)  01/15/20 143 lb 6.1 oz (65 kg)  07/09/19 151 lb 12.8 oz (68.9 kg)      Other studies Reviewed: Additional studies/ records that were reviewed today include:  None Review of the above records demonstrates:   NA   ASSESSMENT AND PLAN:   HTN:   His blood pressure is is controlled.  He will continue with the meds as listed.   ASCENDING AORTIC DILATATION: This was noted on his CT. he needs follow-up CT of his aorta with contrast.   Current medicines are reviewed at length with the patient today.  The patient does not have concerns regarding medicines.  The following changes have been made:  None  Labs/ tests ordered today include:   Orders Placed This Encounter  Procedures  . CT ANGIO CHEST AORTA W/CM & OR WO/CM     Disposition:   FU with me in 12 months.    Signed, Rollene Rotunda, MD  09/01/2020 1:24 PM     [facility]

## 2020-09-01 ENCOUNTER — Other Ambulatory Visit: Payer: Self-pay

## 2020-09-01 ENCOUNTER — Encounter: Payer: Self-pay | Admitting: Cardiology

## 2020-09-01 ENCOUNTER — Ambulatory Visit (INDEPENDENT_AMBULATORY_CARE_PROVIDER_SITE_OTHER): Payer: BC Managed Care – PPO | Admitting: Cardiology

## 2020-09-01 VITALS — BP 130/82 | HR 58 | Resp 14 | Ht 71.0 in | Wt 143.8 lb

## 2020-09-01 DIAGNOSIS — R0602 Shortness of breath: Secondary | ICD-10-CM | POA: Diagnosis not present

## 2020-09-01 DIAGNOSIS — I77819 Aortic ectasia, unspecified site: Secondary | ICD-10-CM | POA: Diagnosis not present

## 2020-09-01 DIAGNOSIS — I7789 Other specified disorders of arteries and arterioles: Secondary | ICD-10-CM | POA: Diagnosis not present

## 2020-09-01 DIAGNOSIS — I10 Essential (primary) hypertension: Secondary | ICD-10-CM

## 2020-09-01 NOTE — Patient Instructions (Signed)
Medication Instructions:  No changes *If you need a refill on your cardiac medications before your next appointment, please call your pharmacy*  Lab Work: None ordered this visit  Testing/Procedures: CT Aorta  Follow-Up: At [facility], you and your health needs are our priority.  As part of our continuing mission to provide you with exceptional heart care, we have created designated Provider Care Teams.  These Care Teams include your primary Cardiologist (physician) and Advanced Practice Providers (APPs -  Physician Assistants and Nurse Practitioners) who all work together to provide you with the care you need, when you need it.  Your next appointment:   12 month(s)  You will receive a reminder letter in the mail two months in advance. If you don't receive a letter, please call our office to schedule the follow-up appointment.  The format for your next appointment:   In Person  Provider:   Rollene Rotunda, MD

## 2020-09-03 NOTE — Addendum Note (Signed)
Addended by: Dorris Fetch on: 09/03/2020 02:14 PM   Modules accepted: Orders

## 2020-09-10 ENCOUNTER — Telehealth: Payer: Self-pay | Admitting: *Deleted

## 2020-09-10 ENCOUNTER — Other Ambulatory Visit: Payer: Self-pay

## 2020-09-10 DIAGNOSIS — I10 Essential (primary) hypertension: Secondary | ICD-10-CM

## 2020-09-10 NOTE — Telephone Encounter (Signed)
-----   Message from Renee Rival, RT sent at 09/10/2020 11:11 AM EST ----- Regarding: RE: BMET I'll call him and have him come to Dr. Lindaann Slough office. I still have to call the patient and find out exactly what day will be available, I just need the order placed please  Taryn  ----- Message ----- From: Sampson Goon, RN Sent: 09/10/2020  11:07 AM EST To: Renee Rival, RT Subject: RE: BMET                                       Where do you want this drawn and what time frame? For Dr. Antoine Poche?  Thank you  Sheppard Evens  ----- Message ----- From: Renee Rival, RT Sent: 09/10/2020   9:49 AM EST To: [facility] Triage Subject: BMET                                           Hey, I need a BMET drawn on this patient. Can you please place the order?  Thanks! Benna Dunks

## 2020-09-10 NOTE — Telephone Encounter (Signed)
-----   Message from Renee Rival, RT sent at 09/10/2020  9:48 AM EST ----- Regarding: BMET Hey, I need a BMET drawn on this patient. Can you please place the order?  Thanks! Benna Dunks

## 2020-09-11 ENCOUNTER — Telehealth: Payer: Self-pay | Admitting: Cardiology

## 2020-09-11 LAB — BASIC METABOLIC PANEL
BUN/Creatinine Ratio: 23 — ABNORMAL HIGH (ref 9–20)
BUN: 29 mg/dL — ABNORMAL HIGH (ref 6–24)
CO2: 27 mmol/L (ref 20–29)
Calcium: 9.3 mg/dL (ref 8.7–10.2)
Chloride: 101 mmol/L (ref 96–106)
Creatinine, Ser: 1.24 mg/dL (ref 0.76–1.27)
GFR calc Af Amer: 75 mL/min/{1.73_m2} (ref >59)
GFR calc non Af Amer: 65 mL/min/{1.73_m2} (ref >59)
Glucose: 80 mg/dL (ref 65–99)
Potassium: 4.1 mmol/L (ref 3.5–5.2)
Sodium: 141 mmol/L (ref 134–144)

## 2020-09-11 NOTE — Telephone Encounter (Signed)
Follow Up:     Pt said he received a call this morning, he did not know who called him.

## 2020-09-11 NOTE — Telephone Encounter (Signed)
Spoke with patient. Cannot find documentation of anyone attempting to reach patient. Patient verbalized understanding.

## 2020-09-17 ENCOUNTER — Other Ambulatory Visit: Payer: Self-pay

## 2020-09-17 ENCOUNTER — Ambulatory Visit (INDEPENDENT_AMBULATORY_CARE_PROVIDER_SITE_OTHER)
Admission: RE | Admit: 2020-09-17 | Discharge: 2020-09-17 | Disposition: A | Discharge status: Home / Self Care | Payer: BC Managed Care – PPO | Source: Ambulatory Visit | Attending: Cardiology | Admitting: Cardiology

## 2020-09-17 DIAGNOSIS — I7789 Other specified disorders of arteries and arterioles: Secondary | ICD-10-CM | POA: Diagnosis not present

## 2020-09-17 DIAGNOSIS — I2510 Atherosclerotic heart disease of native coronary artery without angina pectoris: Secondary | ICD-10-CM | POA: Diagnosis not present

## 2020-09-17 DIAGNOSIS — I712 Thoracic aortic aneurysm, without rupture: Secondary | ICD-10-CM | POA: Diagnosis not present

## 2020-09-17 DIAGNOSIS — I253 Aneurysm of heart: Secondary | ICD-10-CM | POA: Diagnosis not present

## 2020-09-17 DIAGNOSIS — I728 Aneurysm of other specified arteries: Secondary | ICD-10-CM | POA: Diagnosis not present

## 2020-09-17 MED ORDER — IOHEXOL 350 MG/ML SOLN
100.0000 mL | Freq: Once | INTRAVENOUS | Status: AC | PRN
  Administered 2020-09-17: 100 mL via INTRAVENOUS

## 2020-10-12 ENCOUNTER — Other Ambulatory Visit: Payer: Self-pay | Admitting: Cardiology

## 2020-10-13 ENCOUNTER — Other Ambulatory Visit: Payer: Self-pay | Admitting: Cardiology

## 2021-04-24 ENCOUNTER — Other Ambulatory Visit: Payer: Self-pay | Admitting: Cardiology

## 2021-04-24 ENCOUNTER — Other Ambulatory Visit: Payer: Self-pay

## 2021-04-24 MED ORDER — AMLODIPINE BESYLATE 5 MG PO TABS: 5.0000 mg | ORAL_TABLET | Freq: Every day | ORAL | 3 refills | Status: DC

## 2021-04-28 ENCOUNTER — Other Ambulatory Visit: Payer: Self-pay | Admitting: Cardiology

## 2021-09-23 NOTE — Progress Notes (Signed)
Cardiology Office Note   Date:  09/24/2021   ID:  Dennison Mascot, DOB Feb 11, 1966, MRN [id number]  PCP:  Ardith Dark, MD  Cardiologist:   Rollene Rotunda, MD   Chief Complaint  Patient presents with   Aortic Enlargement      History of Present Illness: Lee Henderson is a 56 y.o. male who is referred by Ardith Dark, MD for evaluation of a strong family history of CAD and new HTN.  In Feb he had a negative POET (Plain Old Exercise Treadmill).  The patient had a calcium score of 0.     He returns for follow up.  Since I last saw him he has done well.  He has been exercising and still doing he is hiking with his last son.  He is going to fill.  His blood pressures been running high above 140 systolic.  He is not having however any cardiovascular symptoms.  He is not having any palpitations, presyncope or syncope.  He has had no shortness of breath, PND or orthopnea.  He has maintained his 50 pound weight loss.   Past Medical History:  Diagnosis Date   Gout    HTN (hypertension)     Past Surgical History:  Procedure Laterality Date   KNEE SURGERY       Current Outpatient Medications  Medication Sig Dispense Refill   EPINEPHrine 0.3 mg/0.3 mL IJ SOAJ injection Inject 0.3 mLs (0.3 mg total) into the muscle as needed. 1 each 3   amLODipine (NORVASC) 5 MG tablet Take 1.5 tablets (7.5 mg total) by mouth daily. 90 tablet 3   chlorthalidone (HYGROTON) 25 MG tablet TAKE 1 TABLET(25 MG) BY MOUTH DAILY (Patient not taking: Reported on 09/24/2021) 90 tablet 0   No current facility-administered medications for this visit.    Allergies:   Bee venom    ROS:  Please see the history of present illness.   Otherwise, review of systems are positive for none.   All other systems are reviewed and negative.    PHYSICAL EXAM: VS:  BP 140/78    Pulse (!) 43    Ht 5\' 11"  (1.803 m)    Wt 144 lb 6.4 oz (65.5 kg)    SpO2 98%    BMI 20.14 kg/m  , BMI Body mass index is 20.14 kg/m. GENERAL:  Well  appearing NECK:  No jugular venous distention, waveform within normal limits, carotid upstroke brisk and symmetric, no bruits, no thyromegaly LUNGS:  Clear to auscultation bilaterally CHEST:  Unremarkable HEART:  PMI not displaced or sustained,S1 and S2 within normal limits, no S3, no S4, no clicks, no rubs, no murmurs ABD:  Flat, positive bowel sounds normal in frequency in pitch, no bruits, no rebound, no guarding, no midline pulsatile mass, no hepatomegaly, no splenomegaly EXT:  2 plus pulses throughout, no edema, no cyanosis no clubbing  EKG:  EKG is  ordered today. Normal sinus rhythm, rate 43, axis within normal limits, intervals within normal limits, early repolarization pattern.  Recent Labs: No results found for requested labs within last 8760 hours.    Lipid Panel No results found for: CHOL, TRIG, HDL, CHOLHDL, VLDL, LDLCALC, LDLDIRECT    Wt Readings from Last 3 Encounters:  09/24/21 144 lb 6.4 oz (65.5 kg)  09/01/20 143 lb 12.8 oz (65.2 kg)  01/15/20 143 lb 6.1 oz (65 kg)      Other studies Reviewed: Additional studies/ records that were reviewed today include:  None Review  of the above records demonstrates:   NA   ASSESSMENT AND PLAN:   HTN:   His blood pressure is not controlled.  I am going to have him start taking Norvasc 7.5 mg daily.  He had previously been on 10 mg but his blood pressure fell and we went back down.  ASCENDING AORTIC DILATATION: He had a 49 mm aorta and needs follow-up CT aortogram.   BRADYCARDIA: He has no symptoms related to this.  No change in therapy.    Current medicines are reviewed at length with the patient today.  The patient does not have concerns regarding medicines.  The following changes have been made: As above  Labs/ tests ordered today include:   Orders Placed This Encounter  Procedures   CT ANGIO CHEST AORTA W/CM & OR WO/CM   Basic metabolic panel   EKG 12-Lead     Disposition:   FU with me in 12 months.     Signed, Rollene Rotunda, MD  09/24/2021 3:16 PM    [facility]

## 2021-09-24 ENCOUNTER — Other Ambulatory Visit: Payer: Self-pay

## 2021-09-24 ENCOUNTER — Ambulatory Visit (INDEPENDENT_AMBULATORY_CARE_PROVIDER_SITE_OTHER): Payer: BC Managed Care – PPO | Admitting: Cardiology

## 2021-09-24 ENCOUNTER — Encounter: Payer: Self-pay | Admitting: Cardiology

## 2021-09-24 VITALS — BP 140/78 | HR 43 | Ht 71.0 in | Wt 144.4 lb

## 2021-09-24 DIAGNOSIS — I77819 Aortic ectasia, unspecified site: Secondary | ICD-10-CM | POA: Diagnosis not present

## 2021-09-24 DIAGNOSIS — Z01812 Encounter for preprocedural laboratory examination: Secondary | ICD-10-CM | POA: Diagnosis not present

## 2021-09-24 DIAGNOSIS — I10 Essential (primary) hypertension: Secondary | ICD-10-CM

## 2021-09-24 DIAGNOSIS — Z01818 Encounter for other preprocedural examination: Secondary | ICD-10-CM

## 2021-09-24 MED ORDER — AMLODIPINE BESYLATE 5 MG PO TABS: 7.5000 mg | ORAL_TABLET | Freq: Every day | ORAL | 3 refills | Status: DC

## 2021-09-24 NOTE — Patient Instructions (Signed)
Medication Instructions:  INCREASE Amlodipine to 7.5 mg daily *If you need a refill on your cardiac medications before your next appointment, please call your pharmacy*   Lab Work: Your physician recommends that you return for lab work 1 week prior :  BMET  If you have labs (blood work) drawn today and your tests are completely normal, you will receive your results only by: [employer] (if you have MyChart) OR A paper copy in the mail If you have any lab test that is abnormal or we need to change your treatment, we will call you to review the results.  Testing/Procedures: Your physician has requested that you have cardiac CT. Cardiac computed tomography (CT) is a painless test that uses an x-ray machine to take clear, detailed pictures of your heart. For further information please visit [email or web address] Please follow instruction sheet as given.   Follow-Up: At [facility], you and your health needs are our priority.  As part of our continuing mission to provide you with exceptional heart care, we have created designated Provider Care Teams.  These Care Teams include your primary Cardiologist (physician) and Advanced Practice Providers (APPs -  Physician Assistants and Nurse Practitioners) who all work together to provide you with the care you need, when you need it.  Your next appointment:   1 year(s)  The format for your next appointment:   In Person  Provider:   Rollene Rotunda, MD    Other Instructions

## 2021-09-29 ENCOUNTER — Encounter: Payer: Self-pay | Admitting: Cardiology

## 2021-10-03 ENCOUNTER — Encounter: Payer: Self-pay | Admitting: Family Medicine

## 2021-10-05 ENCOUNTER — Encounter: Payer: BC Managed Care – PPO | Admitting: Family Medicine

## 2021-10-09 DIAGNOSIS — Z01818 Encounter for other preprocedural examination: Secondary | ICD-10-CM | POA: Diagnosis not present

## 2021-10-09 LAB — BASIC METABOLIC PANEL
BUN/Creatinine Ratio: 20 (ref 9–20)
BUN: 19 mg/dL (ref 6–24)
CO2: 27 mmol/L (ref 20–29)
Calcium: 8.9 mg/dL (ref 8.7–10.2)
Chloride: 101 mmol/L (ref 96–106)
Creatinine, Ser: 0.97 mg/dL (ref 0.76–1.27)
Glucose: 79 mg/dL (ref 70–99)
Potassium: 4.6 mmol/L (ref 3.5–5.2)
Sodium: 138 mmol/L (ref 134–144)
eGFR: 91 mL/min/{1.73_m2} (ref >59)

## 2021-10-14 ENCOUNTER — Ambulatory Visit (INDEPENDENT_AMBULATORY_CARE_PROVIDER_SITE_OTHER)
Admission: RE | Admit: 2021-10-14 | Discharge: 2021-10-14 | Disposition: A | Discharge status: Home / Self Care | Payer: BC Managed Care – PPO | Source: Ambulatory Visit | Attending: Cardiology | Admitting: Cardiology

## 2021-10-14 ENCOUNTER — Encounter: Payer: BC Managed Care – PPO | Admitting: Family Medicine

## 2021-10-14 ENCOUNTER — Other Ambulatory Visit: Payer: Self-pay

## 2021-10-14 DIAGNOSIS — I77819 Aortic ectasia, unspecified site: Secondary | ICD-10-CM | POA: Diagnosis not present

## 2021-10-14 DIAGNOSIS — I7121 Aneurysm of the ascending aorta, without rupture: Secondary | ICD-10-CM | POA: Diagnosis not present

## 2021-10-14 DIAGNOSIS — I77810 Thoracic aortic ectasia: Secondary | ICD-10-CM | POA: Diagnosis not present

## 2021-10-14 MED ORDER — IOHEXOL 350 MG/ML SOLN
100.0000 mL | Freq: Once | INTRAVENOUS | Status: AC | PRN
  Administered 2021-10-14: 80 mL via INTRAVENOUS

## 2021-12-29 ENCOUNTER — Encounter: Payer: Self-pay | Admitting: Cardiology

## 2021-12-29 MED ORDER — CHLORTHALIDONE 25 MG PO TABS: ORAL_TABLET | ORAL | 2 refills | Status: DC

## 2022-01-28 ENCOUNTER — Ambulatory Visit (INDEPENDENT_AMBULATORY_CARE_PROVIDER_SITE_OTHER): Payer: 59 | Admitting: Family Medicine

## 2022-01-28 ENCOUNTER — Encounter: Payer: Self-pay | Admitting: Family Medicine

## 2022-01-28 VITALS — BP 118/71 | HR 56 | Temp 99.2°F | Ht 71.0 in | Wt 140.2 lb

## 2022-01-28 DIAGNOSIS — Z125 Encounter for screening for malignant neoplasm of prostate: Secondary | ICD-10-CM

## 2022-01-28 DIAGNOSIS — Z13220 Encounter for screening for lipoid disorders: Secondary | ICD-10-CM | POA: Diagnosis not present

## 2022-01-28 DIAGNOSIS — Z0000 Encounter for general adult medical examination without abnormal findings: Secondary | ICD-10-CM

## 2022-01-28 DIAGNOSIS — Z0001 Encounter for general adult medical examination with abnormal findings: Secondary | ICD-10-CM | POA: Diagnosis not present

## 2022-01-28 DIAGNOSIS — Z23 Encounter for immunization: Secondary | ICD-10-CM | POA: Diagnosis not present

## 2022-01-28 DIAGNOSIS — Z1159 Encounter for screening for other viral diseases: Secondary | ICD-10-CM

## 2022-01-28 DIAGNOSIS — Z1211 Encounter for screening for malignant neoplasm of colon: Secondary | ICD-10-CM

## 2022-01-28 DIAGNOSIS — I10 Essential (primary) hypertension: Secondary | ICD-10-CM

## 2022-01-28 LAB — PSA: PSA: 1.82 ng/mL (ref 0.10–4.00)

## 2022-01-28 LAB — LIPID PANEL
Cholesterol: 186 mg/dL (ref 0–200)
HDL: 77.1 mg/dL (ref >39.00)
LDL Cholesterol: 92 mg/dL (ref 0–99)
NonHDL: 108.77
Total CHOL/HDL Ratio: 2
Triglycerides: 84 mg/dL (ref 0.0–149.0)
VLDL: 16.8 mg/dL (ref 0.0–40.0)

## 2022-01-28 NOTE — Progress Notes (Signed)
? ?Chief Complaint:  ?Lee Henderson is a 56 y.o. male who presents today for his annual comprehensive physical exam.   ? ?Assessment/Plan:  ?Chronic Problems Addressed Today: ?Hypertension ?At goal.  He has done a great job with lifestyle changes.  We will continue amlodipine 5 mg daily chlorthalidone 25 mg daily. ? ?Preventative Healthcare: ?Filled out Form today.  Blood pressures well controlled.  We will check labs and refer for colonoscopy. Tdap given today.  ? ?Patient Counseling(The following topics were reviewed and/or handout was given): ? -Nutrition: Stressed importance of moderation in sodium/caffeine intake, saturated fat and cholesterol, caloric balance, sufficient intake of fresh fruits, vegetables, and fiber. ? -Stressed the importance of regular exercise.  ? -Substance Abuse: Discussed cessation/primary prevention of tobacco, alcohol, or other drug use; driving or other dangerous activities under the influence; availability of treatment for abuse. ? -Injury prevention: Discussed safety belts, safety helmets, smoke detector, smoking near bedding or upholstery.  ? -Sexuality: Discussed sexually transmitted diseases, partner selection, use of condoms, avoidance of unintended pregnancy and contraceptive alternatives.  ? -Dental health: Discussed importance of regular tooth brushing, flossing, and dental visits. ? -Health maintenance and immunizations reviewed. Please refer to Health maintenance section. ? ?Return to care in 1 year for next preventative visit.  ? ?  ?Subjective:  ?HPI: ? ?He has no acute complaints today.  ? ?Lifestyle ?Diet: Mediterranean diet.  Plenty of fruits and vegetables.  ?Exercise: Biking and hiking.  ? ? ?  01/28/2022  ? 12:55 PM  ?Depression screen PHQ 2/9  ?Decreased Interest 0  ?Down, Depressed, Hopeless 0  ?PHQ - 2 Score 0  ? ? ?Health Maintenance Due  ?Topic Date Due  ? Hepatitis C Screening  Never done  ? COLONOSCOPY (Pts 45-60yrs Insurance coverage will need to be confirmed)   Never done  ?  ? ?ROS: Per HPI, otherwise a complete review of systems was negative.  ? ?PMH: ? ?The following were reviewed and entered/updated in epic: ?Past Medical History:  ?Diagnosis Date  ? Gout   ? HTN (hypertension)   ? ?Patient Active Problem List  ? Diagnosis Date Noted  ? Acquired dilation of ascending aorta and aortic root (HCC) 07/08/2019  ? SOB (shortness of breath) 11/28/2018  ? Ascending aortic aneurysm (HCC) 11/28/2018  ? Hypertension 10/04/2018  ? Family history of chronic ischemic heart disease 10/04/2018  ? ?Past Surgical History:  ?Procedure Laterality Date  ? KNEE SURGERY    ? ? ?Family History  ?Problem Relation Age of Onset  ? Breast cancer Mother   ? Heart disease Father 75  ?     Stents, CABG  ? Hypertension Father   ? Alcohol abuse Paternal Grandfather   ? Heart attack Paternal Grandfather   ? Heart disease Paternal Uncle   ? Heart attack Paternal Uncle   ? ? ?Medications- reviewed and updated ?Current Outpatient Medications  ?Medication Sig Dispense Refill  ? amLODipine (NORVASC) 5 MG tablet Take 1.5 tablets (7.5 mg total) by mouth daily. 90 tablet 3  ? chlorthalidone (HYGROTON) 25 MG tablet TAKE 1 TABLET(25 MG) BY MOUTH DAILY 90 tablet 2  ? EPINEPHrine 0.3 mg/0.3 mL IJ SOAJ injection Inject 0.3 mLs (0.3 mg total) into the muscle as needed. 1 each 3  ? ?No current facility-administered medications for this visit.  ? ? ?Allergies-reviewed and updated ?Allergies  ?Allergen Reactions  ? Bee Venom Anaphylaxis  ? ? ?Social History  ? ?Socioeconomic History  ? Marital status: Married  ?  Spouse name: Not on file  ? Number of children: Not on file  ? Years of education: Not on file  ? Highest education level: Not on file  ?Occupational History  ? Not on file  ?Tobacco Use  ? Smoking status: Never  ? Smokeless tobacco: Never  ?Vaping Use  ? Vaping Use: Never used  ?Substance and Sexual Activity  ? Alcohol use: Yes  ?  Comment: Occu  ? Drug use: Never  ? Sexual activity: Yes  ?Other Topics  Concern  ? Not on file  ?Social History Narrative  ? Married, three children.    ? ?Social Determinants of Health  ? ?Financial Resource Strain: Not on file  ?Food Insecurity: Not on file  ?Transportation Needs: Not on file  ?Physical Activity: Not on file  ?Stress: Not on file  ?Social Connections: Not on file  ? ?   ?  ?Objective:  ?Physical Exam: ?BP 118/71 (BP Location: Left Arm)   Pulse (!) 56   Temp 99.2 ?F (37.3 ?C) (Temporal)   Ht 5\' 11"  (1.803 m)   Wt 140 lb 3.2 oz (63.6 kg)   SpO2 99%   BMI 19.55 kg/m?   ?Body mass index is 19.55 kg/m?. ?Wt Readings from Last 3 Encounters:  ?01/28/22 140 lb 3.2 oz (63.6 kg)  ?09/24/21 144 lb 6.4 oz (65.5 kg)  ?09/01/20 143 lb 12.8 oz (65.2 kg)  ? ?Gen: NAD, resting comfortably ?HEENT: TMs normal bilaterally. OP clear. No thyromegaly noted.  ?CV: RRR with no murmurs appreciated ?Pulm: NWOB, CTAB with no crackles, wheezes, or rhonchi ?GI: Normal bowel sounds present. Soft, Nontender, Nondistended. ?MSK: no edema, cyanosis, or clubbing noted ?Skin: warm, dry ?Neuro: CN2-12 grossly intact. Strength 5/5 in upper and lower extremities. Reflexes symmetric and intact bilaterally.  ?Psych: Normal affect and thought content ?   ? ?09/03/20. [name], MD ?01/28/2022 1:40 PM  ?

## 2022-01-28 NOTE — Patient Instructions (Signed)
It was very nice to see you today! ? ?Keep up the good work! ? ?We will check blood work today. ? ?Come back to see me in a year for your next physical. Come back to see me sooner if needed ? ?Take care, ?Dr Jimmey Ralph ? ?PLEASE NOTE: ? ?If you had any lab tests please let us know if you have not heard back within a few days. You may see your results on mychart before we have a chance to review them but we will give you a call once they are reviewed by [place]. If we ordered any referrals today, please let us know if you have not heard from their office within the next week.  ? ?Please try these tips to maintain a healthy lifestyle: ? ?Eat at least 3 REAL meals and 1-2 snacks per day.  Aim for no more than 5 hours between eating.  If you eat breakfast, please do so within one hour of getting up.  ? ?Each meal should contain half fruits/vegetables, one quarter protein, and one quarter carbs (no bigger than a computer mouse) ? ?Cut down on sweet beverages. This includes juice, soda, and sweet tea.  ? ?Drink at least 1 glass of water with each meal and aim for at least 8 glasses per day ? ?Exercise at least 150 minutes every week.   ? ?Preventive Care 58-58 Years Old, Male ?Preventive care refers to lifestyle choices and visits with your health care provider that can promote health and wellness. Preventive care visits are also called wellness exams. ?What can I expect for my preventive care visit? ?Counseling ?During your preventive care visit, your health care provider may ask about your: ?Medical history, including: ?Past medical problems. ?Family medical history. ?Current health, including: ?Emotional well-being. ?Home life and relationship well-being. ?Sexual activity. ?Lifestyle, including: ?Alcohol, nicotine or tobacco, and drug use. ?Access to firearms. ?Diet, exercise, and sleep habits. ?Safety issues such as seatbelt and bike helmet use. ?Sunscreen use. ?Work and work Astronomer. ?Physical exam ?Your health care  provider will check your: ?Height and weight. These may be used to calculate your BMI (body mass index). BMI is a measurement that tells if you are at a healthy weight. ?Waist circumference. This measures the distance around your waistline. This measurement also tells if you are at a healthy weight and may help predict your risk of certain diseases, such as type 2 diabetes and high blood pressure. ?Heart rate and blood pressure. ?Body temperature. ?Skin for abnormal spots. ?What immunizations do I need? ? ?Vaccines are usually given at various ages, according to a schedule. Your health care provider will recommend vaccines for you based on your age, medical history, and lifestyle or other factors, such as travel or where you work. ?What tests do I need? ?Screening ?Your health care provider may recommend screening tests for certain conditions. This may include: ?Lipid and cholesterol levels. ?Diabetes screening. This is done by checking your blood sugar (glucose) after you have not eaten for a while (fasting). ?Hepatitis B test. ?Hepatitis C test. ?HIV (human immunodeficiency virus) test. ?STI (sexually transmitted infection) testing, if you are at risk. ?Lung cancer screening. ?Prostate cancer screening. ?Colorectal cancer screening. ?Talk with your health care provider about your test results, treatment options, and if necessary, the need for more tests. ?Follow these instructions at home: ?Eating and drinking ? ?Eat a diet that includes fresh fruits and vegetables, whole grains, lean protein, and low-fat dairy products. ?Take vitamin and mineral supplements as recommended  by your health care provider. ?Do not drink alcohol if your health care provider tells you not to drink. ?If you drink alcohol: ?Limit how much you have to 0-2 drinks a day. ?Know how much alcohol is in your drink. In the [place] one drink equals one 12 oz bottle of beer (355 mL), one 5 oz glass of wine (148 mL), or one 1? oz glass of hard liquor  (44 mL). ?Lifestyle ?Brush your teeth every morning and night with fluoride toothpaste. Floss one time each day. ?Exercise for at least 30 minutes 5 or more days each week. ?Do not use any products that contain nicotine or tobacco. These products include cigarettes, chewing tobacco, and vaping devices, such as e-cigarettes. If you need help quitting, ask your health care provider. ?Do not use drugs. ?If you are sexually active, practice safe sex. Use a condom or other form of protection to prevent STIs. ?Take aspirin only as told by your health care provider. Make sure that you understand how much to take and what form to take. Work with your health care provider to find out whether it is safe and beneficial for you to take aspirin daily. ?Find healthy ways to manage stress, such as: ?Meditation, yoga, or listening to music. ?Journaling. ?Talking to a trusted person. ?Spending time with friends and family. ?Minimize exposure to UV radiation to reduce your risk of skin cancer. ?Safety ?Always wear your seat belt while driving or riding in a vehicle. ?Do not drive: ?If you have been drinking alcohol. Do not ride with someone who has been drinking. ?When you are tired or distracted. ?While texting. ?If you have been using any mind-altering substances or drugs. ?Wear a helmet and other protective equipment during sports activities. ?If you have firearms in your house, make sure you follow all gun safety procedures. ?What's next? ?Go to your health care provider once a year for an annual wellness visit. ?Ask your health care provider how often you should have your eyes and teeth checked. ?Stay up to date on all vaccines. ?This information is not intended to replace advice given to you by your health care provider. Make sure you discuss any questions you have with your health care provider. ?Document Revised: 03/04/2021 Document Reviewed: 03/04/2021 ?Elsevier Patient Education ? 2023 [employer] ? ?

## 2022-01-28 NOTE — Assessment & Plan Note (Signed)
At goal.  He has done a great job with lifestyle changes.  We will continue amlodipine 5 mg daily chlorthalidone 25 mg daily. ?

## 2022-01-29 LAB — HEPATITIS C ANTIBODY
Hepatitis C Ab: NONREACTIVE
SIGNAL TO CUT-OFF: 0.08 (ref <1.00)

## 2022-02-01 NOTE — Progress Notes (Signed)
Please inform patient of the following: ? ?Good news! Labs are normal. Would like for him to keep up the good work and we can recheck in a year or so. ? ?[name]. Jimmey Ralph, MD ?02/01/2022 8:03 AM  ?

## 2022-02-05 ENCOUNTER — Encounter: Payer: Self-pay | Admitting: Internal Medicine

## 2022-03-19 ENCOUNTER — Encounter: Payer: Self-pay | Admitting: *Deleted

## 2022-03-19 NOTE — Telephone Encounter (Signed)
PATIENT NO SHOW PV TODAY. CALLED BOTH NUMBERS LISTED FOR PT-NO ANSWERS. SENT MYCHART MESSAGE TO PT.

## 2022-04-05 ENCOUNTER — Ambulatory Visit (AMBULATORY_SURGERY_CENTER): Payer: Self-pay | Admitting: *Deleted

## 2022-04-05 VITALS — Ht 71.0 in | Wt 137.0 lb

## 2022-04-05 DIAGNOSIS — Z1211 Encounter for screening for malignant neoplasm of colon: Secondary | ICD-10-CM

## 2022-04-05 NOTE — Progress Notes (Signed)
Patient is here in-person for PV. Patient denies any allergies to eggs or soy. Patient denies any problems with anesthesia/sedation. Patient is not on any oxygen at home. Patient is not taking any diet/weight loss medications or blood thinners. Went over procedure prep instructions with the patient. Patient is aware of our care-partner policy.   

## 2022-04-09 ENCOUNTER — Encounter: Payer: 59 | Admitting: Internal Medicine

## 2022-04-19 ENCOUNTER — Encounter: Payer: Self-pay | Admitting: Internal Medicine

## 2022-04-22 ENCOUNTER — Encounter: Payer: Self-pay | Admitting: Internal Medicine

## 2022-04-22 ENCOUNTER — Ambulatory Visit (AMBULATORY_SURGERY_CENTER): Payer: 59 | Admitting: Internal Medicine

## 2022-04-22 VITALS — BP 122/66 | HR 52 | Temp 98.2°F | Resp 14 | Ht 71.0 in | Wt 137.0 lb

## 2022-04-22 DIAGNOSIS — Z1211 Encounter for screening for malignant neoplasm of colon: Secondary | ICD-10-CM

## 2022-04-22 MED ORDER — SODIUM CHLORIDE 0.9 % IV SOLN: 500.0000 mL | Freq: Once | INTRAVENOUS | Status: DC

## 2022-04-22 NOTE — Progress Notes (Signed)
A and O x3. Report to RN. Tolerated MAC anesthesia well.Teeth unchanged after procedure. 

## 2022-04-22 NOTE — Patient Instructions (Addendum)
Normal exam!  No polyps or cancer seen.  Next routine colonoscopy or other screening test in 10 years - 2033.  I appreciate the opportunity to care for you. Iva Boop, MD, [facility]  Read all handouts given to you by your recovery room nurse.   YOU HAD AN ENDOSCOPIC PROCEDURE TODAY AT THE [facility]:   Refer to the procedure report that was given to you for any specific questions about what was found during the examination.  If the procedure report does not answer your questions, please call your gastroenterologist to clarify.  If you requested that your care partner not be given the details of your procedure findings, then the procedure report has been included in a sealed envelope for you to review at your convenience later.  YOU SHOULD EXPECT: Some feelings of bloating in the abdomen. Passage of more gas than usual.  Walking can help get rid of the air that was put into your GI tract during the procedure and reduce the bloating. If you had a lower endoscopy (such as a colonoscopy or flexible sigmoidoscopy) you may notice spotting of blood in your stool or on the toilet paper. If you underwent a bowel prep for your procedure, you may not have a normal bowel movement for a few days.  Please Note:  You might notice some irritation and congestion in your nose or some drainage.  This is from the oxygen used during your procedure.  There is no need for concern and it should clear up in a day or so.  SYMPTOMS TO REPORT IMMEDIATELY:  Following lower endoscopy (colonoscopy or flexible sigmoidoscopy):  Excessive amounts of blood in the stool  Significant tenderness or worsening of abdominal pains  Swelling of the abdomen that is new, acute  Fever of 100F or higher For urgent or emergent issues, a gastroenterologist can be reached at any hour by calling [phone]. Do not use MyChart messaging for urgent concerns.    DIET:  We do recommend a small meal at first, but then you  may proceed to your regular diet.  Drink plenty of fluids but you should avoid alcoholic beverages for 24 hours.  ACTIVITY:  You should plan to take it easy for the rest of today and you should NOT DRIVE or use heavy machinery until tomorrow (because of the sedation medicines used during the test).    FOLLOW UP: Our staff will call the number listed on your records the next business day following your procedure.  We will call around 7:15- 8:00 am to check on you and address any questions or concerns that you may have regarding the information given to you following your procedure. If we do not reach you, we will leave a message.  If you develop any symptoms (ie: fever, flu-like symptoms, shortness of breath, cough etc.) before then, please call [phone].  If you test positive for Covid 19 in the 2 weeks post procedure, please call and report this information to [place].    If any biopsies were taken you will be contacted by phone or by letter within the next 1-3 weeks.  Please call us at [phone] if you have not heard about the biopsies in 3 weeks.    SIGNATURES/CONFIDENTIALITY: You and/or your care partner have signed paperwork which will be entered into your electronic medical record.  These signatures attest to the fact that that the information above on your After Visit Summary has been reviewed and is understood.  Full  responsibility of the confidentiality of this discharge information lies with you and/or your care-partner.  

## 2022-04-22 NOTE — Progress Notes (Signed)
°  Vs by DT in adm ° °Pt's states no medical or surgical changes since previsit or office visit.  °

## 2022-04-22 NOTE — Op Note (Signed)
[facility] Patient Name: Lee Henderson Procedure Date: 04/22/2022 2:40 PM MRN: [id number] Endoscopist: Iva Boop , MD Age: 56 Referring MD:  Date of Birth: 1966-08-12 Gender: Male Account #: [id number] Procedure:                Colonoscopy Indications:              Screening for colorectal malignant neoplasm, This                            is the patient's first colonoscopy Medicines:                Monitored Anesthesia Care Procedure:                Pre-Anesthesia Assessment:                           - Prior to the procedure, a History and Physical                            was performed, and patient medications and                            allergies were reviewed. The patient's tolerance of                            previous anesthesia was also reviewed. The risks                            and benefits of the procedure and the sedation                            options and risks were discussed with the patient.                            All questions were answered, and informed consent                            was obtained. Prior Anticoagulants: The patient has                            taken no previous anticoagulant or antiplatelet                            agents. ASA Grade Assessment: II - A patient with                            mild systemic disease. After reviewing the risks                            and benefits, the patient was deemed in                            satisfactory condition to undergo the procedure.  After obtaining informed consent, the colonoscope                            was passed under direct vision. Throughout the                            procedure, the patient's blood pressure, pulse, and                            oxygen saturations were monitored continuously. The                            Olympus CF-HQ190L (Serial# [id number]) Colonoscope was                            introduced through the anus and  advanced to the the                            cecum, identified by appendiceal orifice and                            ileocecal valve. The colonoscopy was performed                            without difficulty. The patient tolerated the                            procedure well. The quality of the bowel                            preparation was excellent. The ileocecal valve,                            appendiceal orifice, and rectum were photographed.                            The bowel preparation used was Miralax via split                            dose instruction. Scope In: 2:50:07 PM Scope Out: 3:08:17 PM Scope Withdrawal Time: 0 hours 13 minutes 20 seconds  Total Procedure Duration: 0 hours 18 minutes 10 seconds  Findings:                 The perianal and digital rectal examinations were                            normal. Pertinent negatives include normal prostate                            (size, shape, and consistency).                           The entire examined colon appeared normal on direct  and retroflexion views. Complications:            No immediate complications. Estimated Blood Loss:     Estimated blood loss: none. Impression:               - The entire examined colon is normal on direct and                            retroflexion views.                           - No specimens collected. Recommendation:           - Patient has a contact number available for                            emergencies. The signs and symptoms of potential                            delayed complications were discussed with the                            patient. Return to normal activities tomorrow.                            Written discharge instructions were provided to the                            patient.                           - Resume previous diet.                           - Continue present medications.                           - Repeat  colonoscopy in 10 years for screening                            purposes. Iva Boop, MD 04/22/2022 3:15:53 PM This report has been signed electronically.

## 2022-04-22 NOTE — Progress Notes (Signed)
[facility] History and Physical   Primary Care Physician:  Ardith Dark, MD   Reason for Procedure:   CRCA screening  Plan:    colonoscopy     HPI: Lee Henderson is a 56 y.o. male here for screening colonoscopy   Past Medical History:  Diagnosis Date   Gout    HTN (hypertension)     Past Surgical History:  Procedure Laterality Date   KNEE SURGERY     at age 56    Prior to Admission medications   Medication Sig Start Date End Date Taking? Authorizing Provider  amLODipine (NORVASC) 5 MG tablet Take 1.5 tablets (7.5 mg total) by mouth daily. 09/24/21  Yes Hochrein, Fayrene Fearing, MD  chlorthalidone (HYGROTON) 25 MG tablet TAKE 1 TABLET(25 MG) BY MOUTH DAILY 12/29/21  Yes Rollene Rotunda, MD  EPINEPHrine 0.3 mg/0.3 mL IJ SOAJ injection Inject 0.3 mLs (0.3 mg total) into the muscle as needed. Patient not taking: Reported on 04/22/2022 02/05/20   Ardith Dark, MD    Current Outpatient Medications  Medication Sig Dispense Refill   amLODipine (NORVASC) 5 MG tablet Take 1.5 tablets (7.5 mg total) by mouth daily. 90 tablet 3   chlorthalidone (HYGROTON) 25 MG tablet TAKE 1 TABLET(25 MG) BY MOUTH DAILY 90 tablet 2   EPINEPHrine 0.3 mg/0.3 mL IJ SOAJ injection Inject 0.3 mLs (0.3 mg total) into the muscle as needed. (Patient not taking: Reported on 04/22/2022) 1 each 3   Current Facility-Administered Medications  Medication Dose Route Frequency Provider Last Rate Last Admin   0.9 %  sodium chloride infusion  500 mL Intravenous Once Iva Boop, MD        Allergies as of 04/22/2022 - Review Complete 04/22/2022  Allergen Reaction Noted   Bee venom Anaphylaxis 09/24/2021    Family History  Problem Relation Age of Onset   Breast cancer Mother    Heart disease Father 80       Stents, CABG   Hypertension Father    Heart disease Paternal Uncle    Heart attack Paternal Uncle    Alcohol abuse Paternal Grandfather    Heart attack Paternal Grandfather    Colon cancer Neg Hx     Esophageal cancer Neg Hx    Stomach cancer Neg Hx    Rectal cancer Neg Hx     Social History   Socioeconomic History   Marital status: Married    Spouse name: Not on file   Number of children: Not on file   Years of education: Not on file   Highest education level: Not on file  Occupational History   Not on file  Tobacco Use   Smoking status: Never   Smokeless tobacco: Never  Vaping Use   Vaping Use: Never used  Substance and Sexual Activity   Alcohol use: Yes    Alcohol/week: 3.0 - 4.0 standard drinks of alcohol    Types: 3 - 4 Cans of beer per week   Drug use: Never   Sexual activity: Yes  Other Topics Concern   Not on file  Social History Narrative   Married, three children.     Social Determinants of Health   Financial Resource Strain: Not on file  Food Insecurity: Not on file  Transportation Needs: Not on file  Physical Activity: Not on file  Stress: Not on file  Social Connections: Not on file  Intimate Partner Violence: Not on file    Review of Systems:  All other review of  systems negative except as mentioned in the HPI.  Physical Exam: Vital signs BP (!) 158/81   Pulse (!) 46   Temp 98.2 F (36.8 C)   Resp 15   Ht 5\' 11"  (1.803 m)   Wt 137 lb (62.1 kg)   SpO2 100%   BMI 19.11 kg/m   General:   Alert,  Well-developed, well-nourished, pleasant and cooperative in NAD Lungs:  Clear throughout to auscultation.   Heart:  Regular rate and rhythm; no murmurs, clicks, rubs,  or gallops. Abdomen:  Soft, nontender and nondistended. Normal bowel sounds.   Neuro/Psych:  Alert and cooperative. Normal mood and affect. A and O x 3   @Aileen Amore  , MD, [facility] [phone] (pager) 04/22/2022 2:40 PM@

## 2022-04-23 ENCOUNTER — Telehealth: Payer: Self-pay | Admitting: *Deleted

## 2022-04-23 NOTE — Telephone Encounter (Signed)
  Follow up Call-     04/22/2022    2:19 PM  Call back number  Post procedure Call Back phone  # [phone]  Permission to leave phone message Yes     Patient questions:  Do you have a fever, pain , or abdominal swelling? No. Pain Score  0 *  Have you tolerated food without any problems? Yes.    Have you been able to return to your normal activities? Yes.    Do you have any questions about your discharge instructions: Diet   No. Medications  No. Follow up visit  No.  Do you have questions or concerns about your Care? No.  Actions: * If pain score is 4 or above: No action needed, pain <4.

## 2022-06-14 ENCOUNTER — Encounter: Payer: Self-pay | Admitting: *Deleted

## 2022-07-01 ENCOUNTER — Other Ambulatory Visit: Payer: Self-pay | Admitting: Cardiology

## 2022-09-02 ENCOUNTER — Encounter: Payer: Self-pay | Admitting: *Deleted

## 2022-09-06 ENCOUNTER — Other Ambulatory Visit: Payer: Self-pay | Admitting: *Deleted

## 2022-09-06 DIAGNOSIS — I77819 Aortic ectasia, unspecified site: Secondary | ICD-10-CM

## 2022-11-03 ENCOUNTER — Other Ambulatory Visit: Payer: Self-pay | Admitting: Cardiology

## 2022-11-03 ENCOUNTER — Encounter: Payer: Self-pay | Admitting: Cardiology

## 2022-11-03 MED ORDER — AMLODIPINE BESYLATE 5 MG PO TABS: 5.0000 mg | ORAL_TABLET | Freq: Every day | ORAL | 0 refills | Status: DC

## 2022-11-03 MED ORDER — CHLORTHALIDONE 25 MG PO TABS: ORAL_TABLET | ORAL | 0 refills | Status: DC

## 2022-11-04 ENCOUNTER — Encounter: Payer: Self-pay | Admitting: *Deleted

## 2022-12-03 ENCOUNTER — Ambulatory Visit
Admission: RE | Admit: 2022-12-03 | Discharge: 2022-12-03 | Disposition: A | Discharge status: Home / Self Care | Payer: 59 | Source: Ambulatory Visit | Attending: Cardiology | Admitting: Cardiology

## 2022-12-03 DIAGNOSIS — I77819 Aortic ectasia, unspecified site: Secondary | ICD-10-CM

## 2022-12-03 MED ORDER — IOPAMIDOL (ISOVUE-370) INJECTION 76%
75.0000 mL | Freq: Once | INTRAVENOUS | Status: AC | PRN
  Administered 2022-12-03: 75 mL via INTRAVENOUS

## 2022-12-08 DIAGNOSIS — R001 Bradycardia, unspecified: Secondary | ICD-10-CM | POA: Insufficient documentation

## 2022-12-08 NOTE — Progress Notes (Unsigned)
Cardiology Office Note   Date:  12/09/2022   ID:  Lee Henderson, DOB 12/29/1965, MRN [id number]  PCP:  Vivi Barrack, MD  Cardiologist:   Minus Breeding, MD   Chief Complaint  Patient presents with   Ascending aortic dilatation      History of Present Illness: Lee Henderson is a 57 y.o. male who is referred by Vivi Barrack, MD for evaluation of a strong family history of CAD and new HTN.  In Feb he had a negative POET (Plain Old Exercise Treadmill).  The patient had a calcium score of 0.    He does have enlarged aortic root.  He returns for follow up.  He has done well.  He still does a lot of hiking.  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 The patient denies any new symptoms such as chest discomfort, neck or arm discomfort. There has been no new shortness of breath, PND or orthopnea. There have been no reported palpitations, presyncope or syncope.  He lost a lot of weight with diet and exercise a few years ago and has maintained that weight loss.   Past Medical History:  Diagnosis Date   Gout    HTN (hypertension)     Past Surgical History:  Procedure Laterality Date   KNEE SURGERY     at age 45     Current Outpatient Medications  Medication Sig Dispense Refill   amLODipine (NORVASC) 5 MG tablet TAKE 1 TABLET(5 MG) BY MOUTH DAILY 30 tablet 0   chlorthalidone (HYGROTON) 25 MG tablet TAKE 1 TABLET(25 MG) BY MOUTH DAILY 90 tablet 0   EPINEPHrine 0.3 mg/0.3 mL IJ SOAJ injection Inject 0.3 mLs (0.3 mg total) into the muscle as needed. 1 each 3   No current facility-administered medications for this visit.    Allergies:   Bee venom    ROS:  Please see the history of present illness.   Otherwise, review of systems are positive for none.   All other systems are reviewed and negative.    PHYSICAL EXAM: VS:  BP 132/74   Pulse (!) 52   Ht 5\' 11"  (1.803 m)   Wt 138 lb 6.4 oz (62.8 kg)   SpO2 97%   BMI 19.30 kg/m  , BMI Body mass index is 19.3  kg/m. GENERAL:  Well appearing NECK:  No jugular venous distention, waveform within normal limits, carotid upstroke brisk and symmetric, no bruits, no thyromegaly LUNGS:  Clear to auscultation bilaterally CHEST:  Unremarkable HEART:  PMI not displaced or sustained,S1 and S2 within normal limits, no S3, no S4, no clicks, no rubs, no murmurs ABD:  Flat, positive bowel sounds normal in frequency in pitch, no bruits, no rebound, no guarding, no midline pulsatile mass, no hepatomegaly, no splenomegaly EXT:  2 plus pulses throughout, no edema, no cyanosis no clubbing   EKG:  EKG is  ordered today. Normal sinus rhythm, rate 52, axis within normal limits, intervals within normal limits, early repolarization pattern.  Recent Labs: No results found for requested labs within last 365 days.    Lipid Panel    Component Value Date/Time   CHOL 186 01/28/2022 1329   TRIG 84.0 01/28/2022 1329   HDL 77.10 01/28/2022 1329   CHOLHDL 2 01/28/2022 1329   VLDL 16.8 01/28/2022 1329   LDLCALC 92 01/28/2022 1329      Wt Readings from Last 3 Encounters:  12/09/22 138 lb 6.4 oz (62.8 kg)  04/22/22 137 lb (62.1 kg)  04/05/22 137 lb (62.1 kg)      Other studies Reviewed: Additional studies/ records that were reviewed today include:  Labs Review of the above records demonstrates:   See elsewhere   ASSESSMENT AND PLAN:   HTN:   His blood pressure is at target.  No change in therapy.   ASCENDING AORTIC DILATATION: He had a 49 mm  and stable on CT earlier this month.  He has some evidence of aortic atherosclerosis.    I will follow again in 1 year.  We talked about risk reduction but he is really participating in this.  Because his HDL is excellent he has no coronary calcium despite his aortic atherosclerosis I do not think a statin is indicated.  BRADYCARDIA:    This is likely related to good vagal tone.  No change in therapy.  Current medicines are reviewed at length with the patient today.  The  patient does not have concerns regarding medicines.  The following changes have been made: None   Labs/ tests ordered today include: None  Orders Placed This Encounter  Procedures   CT ANGIO CHEST AORTA W/CM & OR WO/CM     Disposition:   FU with me in 12 months.    Signed, Minus Breeding, MD  12/09/2022 2:05 PM    Needles

## 2022-12-09 ENCOUNTER — Ambulatory Visit: Payer: 59 | Attending: Cardiology | Admitting: Cardiology

## 2022-12-09 ENCOUNTER — Encounter: Payer: Self-pay | Admitting: Cardiology

## 2022-12-09 VITALS — BP 132/74 | HR 52 | Ht 71.0 in | Wt 138.4 lb

## 2022-12-09 DIAGNOSIS — R001 Bradycardia, unspecified: Secondary | ICD-10-CM

## 2022-12-09 DIAGNOSIS — I10 Essential (primary) hypertension: Secondary | ICD-10-CM

## 2022-12-09 DIAGNOSIS — I7121 Aneurysm of the ascending aorta, without rupture: Secondary | ICD-10-CM | POA: Diagnosis not present

## 2022-12-09 NOTE — Patient Instructions (Signed)
Medication Instructions:  Continue same medications *If you need a refill on your cardiac medications before your next appointment, please call your pharmacy*   Lab Work: None ordered   Testing/Procedures: Chest CT  in 1 year  11/2023   Follow-Up: At [facility], you and your health needs are our priority.  As part of our continuing mission to provide you with exceptional heart care, we have created designated Provider Care Teams.  These Care Teams include your primary Cardiologist (physician) and Advanced Practice Providers (APPs -  Physician Assistants and Nurse Practitioners) who all work together to provide you with the care you need, when you need it.  We recommend signing up for the patient portal called "MyChart".  Sign up information is provided on this After Visit Summary.  MyChart is used to connect with patients for Virtual Visits (Telemedicine).  Patients are able to view lab/test results, encounter notes, upcoming appointments, etc.  Non-urgent messages can be sent to your provider as well.   To learn more about what you can do with MyChart, go to [email or web address].    Your next appointment: 1 year   Call in Dec to schedule March appointment     Provider:  Dr.Hochrein

## 2022-12-10 NOTE — Addendum Note (Signed)
Addended by: Maryan Rued on: 12/10/2022 02:38 PM   Modules accepted: Orders

## 2023-01-15 ENCOUNTER — Encounter: Payer: Self-pay | Admitting: Family Medicine

## 2023-01-17 NOTE — Telephone Encounter (Signed)
We can refer however insurance typically needs office visit before they will approve referral.

## 2023-01-17 NOTE — Telephone Encounter (Signed)
Ok to place referral for pt to see urology and would you like pt to schedule appt in office here first?

## 2023-02-06 ENCOUNTER — Other Ambulatory Visit: Payer: Self-pay | Admitting: Cardiology

## 2023-03-19 IMAGING — CT CT ANGIO CHEST
3 of 8 series · 19 of 46 positions shown · IV contrast (OMNIPAQUE 350)
Comparison: 09/17/2020

CLINICAL DATA: Aortic root dilatation.

EXAM:
CT ANGIOGRAPHY CHEST WITH CONTRAST
TECHNIQUE: Multidetector CT imaging of the chest was performed using the
standard protocol during bolus administration of intravenous
contrast. Multiplanar CT image reconstructions and MIPs were
obtained to evaluate the vascular anatomy.

[Series 4: aorta 3.0 bf37 2 · axial · 0.69mm/px · z∈[-364,-44]mm · 13 of 125 slices shown]
[im 9/125  lung]
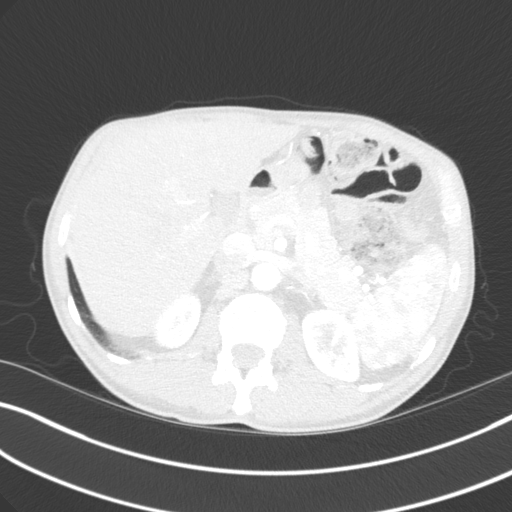
[im 18/125  soft-tissue]
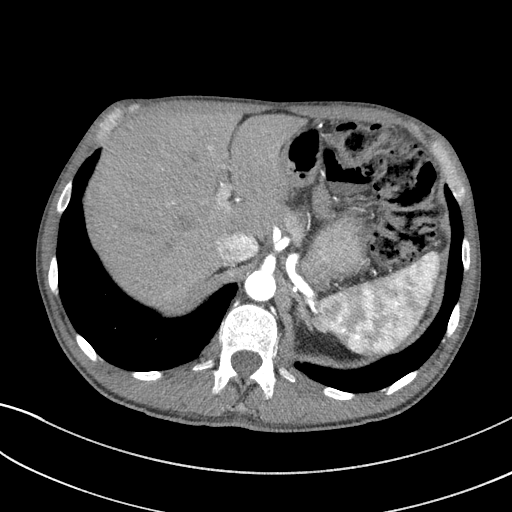
[im 27/125  lung]
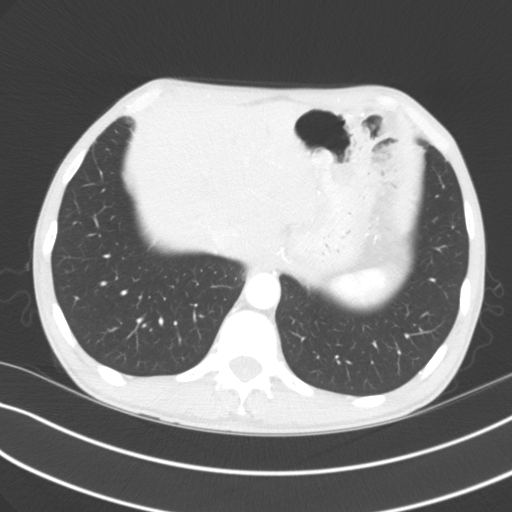
[im 36/125  soft-tissue]
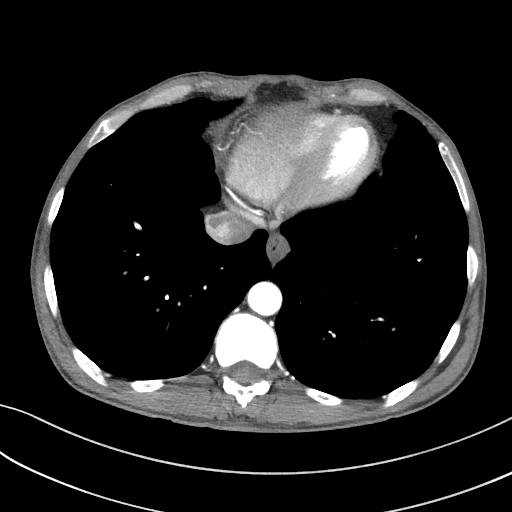
[im 45/125  lung]
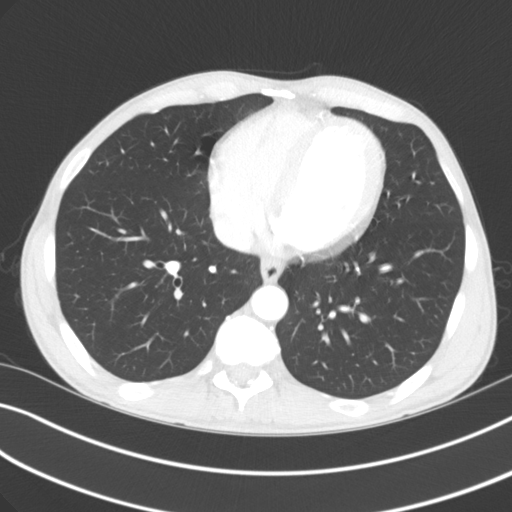
[im 54/125  soft-tissue]
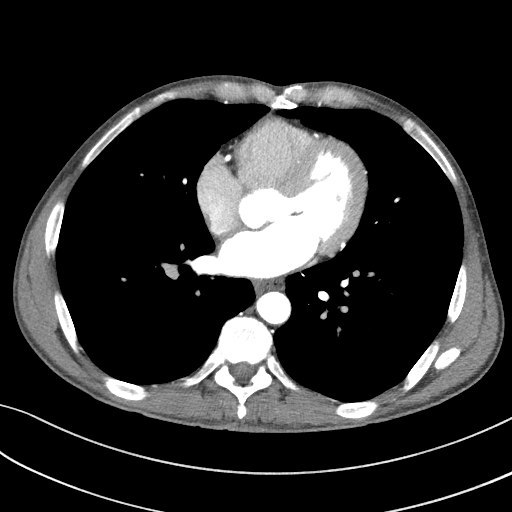
[im 63/125  lung]
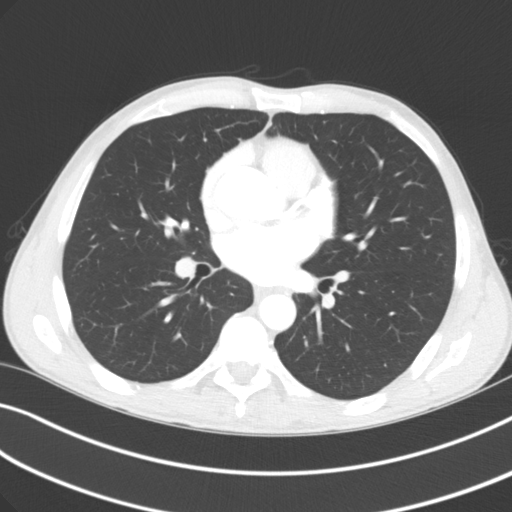
[im 71/125  soft-tissue]
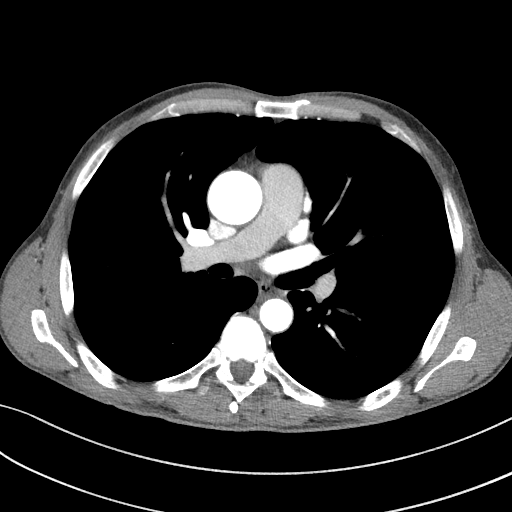
[im 80/125  lung]
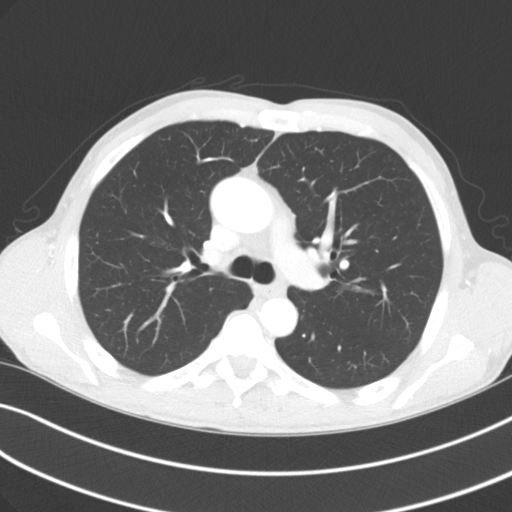
[im 89/125  soft-tissue]
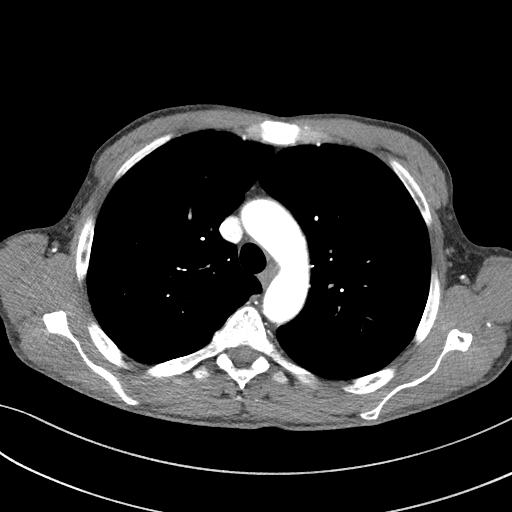
[im 98/125  lung]
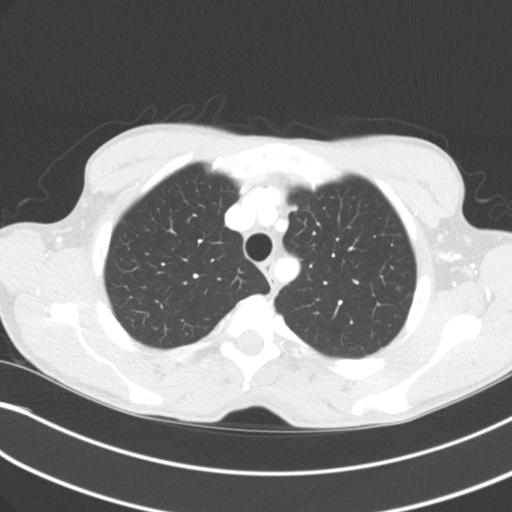
[im 107/125  soft-tissue]
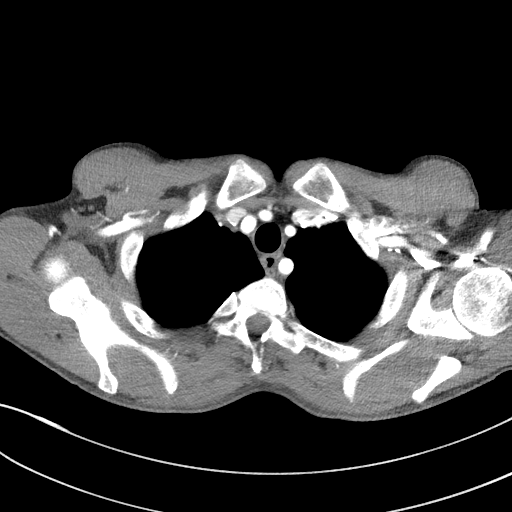
[im 116/125  lung]
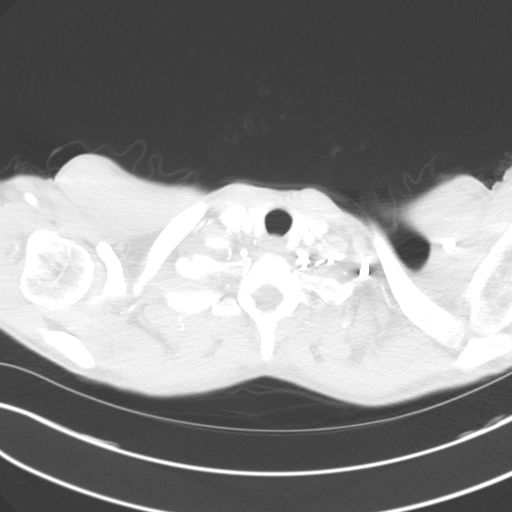

[Series 5: lung · axial · 0.69mm/px · z∈[-364,-256]mm · 3 of 125 slices shown]
[im 9/125  soft-tissue]
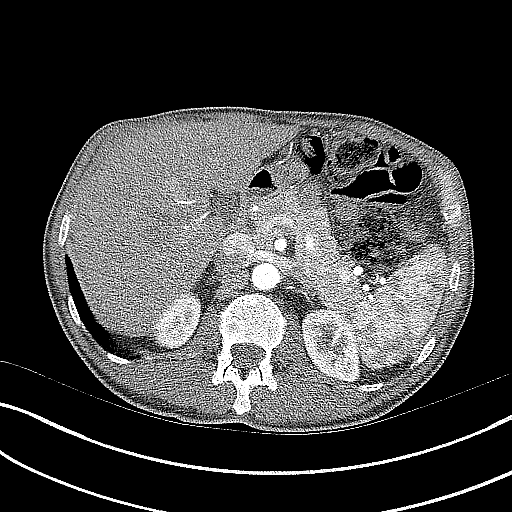
[im 27/125  soft-tissue]
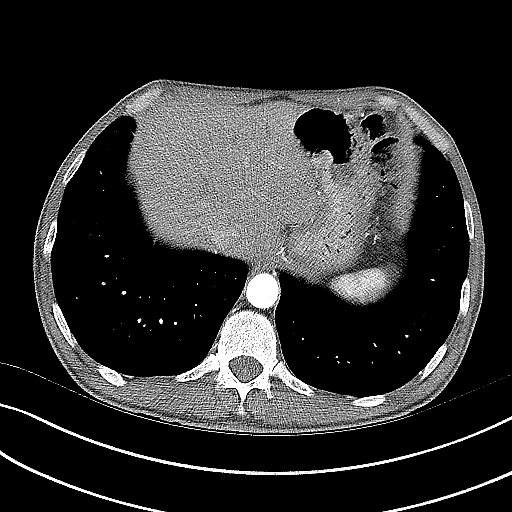
[im 45/125  soft-tissue]
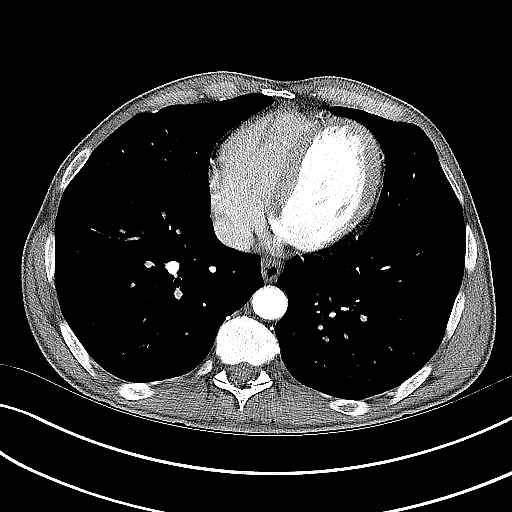

[Series 7: coronals · coronal · 0.69mm/px · 3 of 122 slices shown]
[im 31/122  soft-tissue]
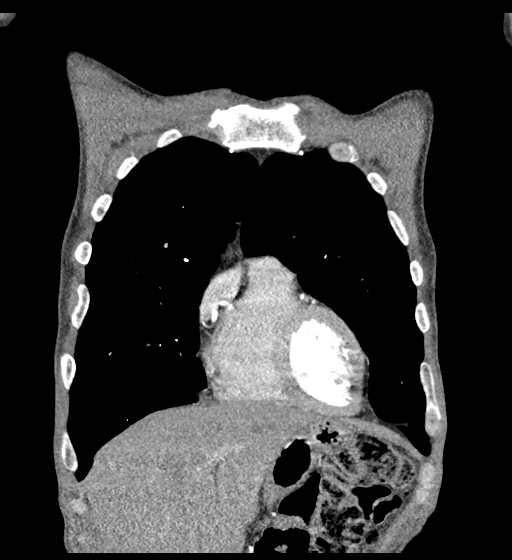
[im 61/122  soft-tissue]
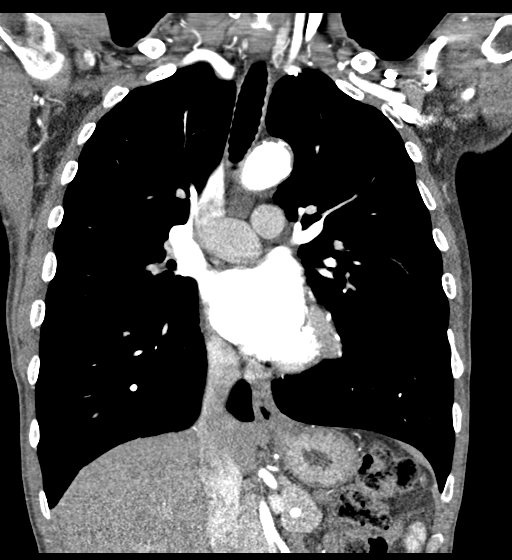
[im 91/122  soft-tissue]
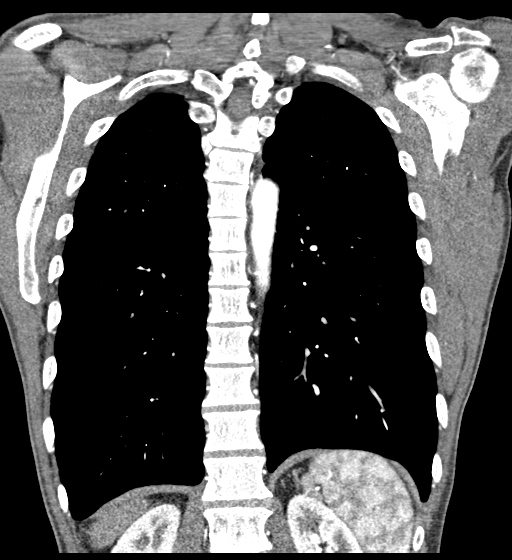

[19 of 46 positions shown; findings below may reference images not displayed]

RADIATION DOSE REDUCTION: This exam was performed according to the
departmental dose-optimization program which includes automated
exposure control, adjustment of the mA and/or kV according to
patient size and/or use of iterative reconstruction technique.

CONTRAST:  80mL OMNIPAQUE IOHEXOL 350 MG/ML SOLN
FINDINGS: Cardiovascular: Dilated aortic root again noted measuring 4.9 cm at
the sinuses of Valsalva, stable. Ascending thoracic aorta 3.5 cm
maximally. Scattered aortic calcifications. No dissection. Heart is
normal size.

Mediastinum/Nodes: No mediastinal, hilar, or axillary adenopathy.
Trachea and esophagus are unremarkable. Thyroid unremarkable.

Lungs/Pleura: Lungs are clear. No focal airspace opacities or
suspicious nodules. No effusions.

Upper Abdomen: Imaging into the upper abdomen demonstrates no acute
findings.

Musculoskeletal: Chest wall soft tissues are unremarkable. No acute
bony abnormality.

Review of the MIP images confirms the above findings.
IMPRESSION: Stable aneurysmal dilatation at the aortic root measuring 4.9 cm at
the sinuses of Valsalva.

No acute cardiopulmonary disease.

Aortic Atherosclerosis ([id number]).

## 2023-03-30 ENCOUNTER — Encounter: Payer: Self-pay | Admitting: Podiatry

## 2023-03-30 ENCOUNTER — Ambulatory Visit (INDEPENDENT_AMBULATORY_CARE_PROVIDER_SITE_OTHER): Payer: 59

## 2023-03-30 ENCOUNTER — Ambulatory Visit: Payer: 59 | Admitting: Podiatry

## 2023-03-30 VITALS — BP 128/73 | HR 52

## 2023-03-30 DIAGNOSIS — M898X9 Other specified disorders of bone, unspecified site: Secondary | ICD-10-CM | POA: Diagnosis not present
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
## 2023-06-21 ENCOUNTER — Ambulatory Visit (INDEPENDENT_AMBULATORY_CARE_PROVIDER_SITE_OTHER): Payer: 59 | Admitting: Family Medicine

## 2023-06-21 ENCOUNTER — Encounter: Payer: Self-pay | Admitting: Family Medicine

## 2023-06-21 VITALS — BP 135/76 | HR 51 | Temp 96.8°F | Ht 71.0 in | Wt 136.0 lb

## 2023-06-21 DIAGNOSIS — Z0001 Encounter for general adult medical examination with abnormal findings: Secondary | ICD-10-CM | POA: Diagnosis not present

## 2023-06-21 DIAGNOSIS — I10 Essential (primary) hypertension: Secondary | ICD-10-CM | POA: Diagnosis not present

## 2023-06-21 NOTE — Assessment & Plan Note (Signed)
At goal on amlodipine 5 mg daily and chlorthalidone 25 mg daily per cardiology.

## 2023-06-21 NOTE — Progress Notes (Signed)
Chief Complaint:  Lee Henderson is a 57 y.o. male who presents today for his annual comprehensive physical exam.    Assessment/Plan:  Chronic Problems Addressed Today: Hypertension At goal on amlodipine 5 mg daily and chlorthalidone 25 mg daily per cardiology.  Preventative Healthcare: Recently had labs done -defer checking for today.  Declined shingles and flu vaccine.  Up-to-date on colonoscopy.  Patient Counseling(The following topics were reviewed and/or handout was given):  -Nutrition: Stressed importance of moderation in sodium/caffeine intake, saturated fat and cholesterol, caloric balance, sufficient intake of fresh fruits, vegetables, and fiber.  -Stressed the importance of regular exercise.   -Substance Abuse: Discussed cessation/primary prevention of tobacco, alcohol, or other drug use; driving or other dangerous activities under the influence; availability of treatment for abuse.   -Injury prevention: Discussed safety belts, safety helmets, smoke detector, smoking near bedding or upholstery.   -Sexuality: Discussed sexually transmitted diseases, partner selection, use of condoms, avoidance of unintended pregnancy and contraceptive alternatives.   -Dental health: Discussed importance of regular tooth brushing, flossing, and dental visits.  -Health maintenance and immunizations reviewed. Please refer to Health maintenance section.  Return to care in 1 year for next preventative visit.     Subjective:  HPI:  He has no acute complaints today.   Lifestyle Diet: Balanced. Plenty of fruits and vegetables.  Exercise: Ride bike 50-55 miles per week.      01/28/2022   12:55 PM  Depression screen PHQ 2/9  Decreased Interest 0  Down, Depressed, Hopeless 0  PHQ - 2 Score 0    Health Maintenance Due  Topic Date Due   HIV Screening  Never done     ROS: Per HPI, otherwise a complete review of systems was negative.   PMH:  The following were reviewed and entered/updated  in epic: Past Medical History:  Diagnosis Date   Gout    HTN (hypertension)    Patient Active Problem List   Diagnosis Date Noted   Bradycardia 12/08/2022   Acquired dilation of ascending aorta and aortic root (HCC) 07/08/2019   SOB (shortness of breath) 11/28/2018   Ascending aortic aneurysm (HCC) 11/28/2018   Hypertension 10/04/2018   Family history of chronic ischemic heart disease 10/04/2018   Past Surgical History:  Procedure Laterality Date   KNEE SURGERY     at age 7    Family History  Problem Relation Age of Onset   Breast cancer Mother    Heart disease Father 41       Stents, CABG   Hypertension Father    Heart disease Paternal Uncle    Heart attack Paternal Uncle    Alcohol abuse Paternal Grandfather    Heart attack Paternal Grandfather    Colon cancer Neg Hx    Esophageal cancer Neg Hx    Stomach cancer Neg Hx    Rectal cancer Neg Hx     Medications- reviewed and updated Current Outpatient Medications  Medication Sig Dispense Refill   amLODipine (NORVASC) 5 MG tablet TAKE 1 TABLET(5 MG) BY MOUTH DAILY 30 tablet 0   chlorthalidone (HYGROTON) 25 MG tablet TAKE 1 TABLET(25 MG) BY MOUTH DAILY 90 tablet 3   EPINEPHrine 0.3 mg/0.3 mL IJ SOAJ injection Inject 0.3 mLs (0.3 mg total) into the muscle as needed. 1 each 3   No current facility-administered medications for this visit.    Allergies-reviewed and updated Allergies  Allergen Reactions   Bee Venom Anaphylaxis    Social History   Socioeconomic History  Marital status: Married    Spouse name: Not on file   Number of children: Not on file   Years of education: Not on file   Highest education level: Not on file  Occupational History   Not on file  Tobacco Use   Smoking status: Never   Smokeless tobacco: Never  Vaping Use   Vaping status: Never Used  Substance and Sexual Activity   Alcohol use: Yes    Alcohol/week: 3.0 - 4.0 standard drinks of alcohol    Types: 3 - 4 Cans of beer per week     Comment: occasionally   Drug use: Never   Sexual activity: Yes  Other Topics Concern   Not on file  Social History Narrative   Married, three children.     Social Determinants of Health   Financial Resource Strain: Not on file  Food Insecurity: Not on file  Transportation Needs: Not on file  Physical Activity: Not on file  Stress: Not on file  Social Connections: Not on file        Objective:  Physical Exam: BP 135/76   Pulse (!) 51   Temp (!) 96.8 F (36 C) (Temporal)   Ht 5\' 11"  (1.803 m)   Wt 136 lb (61.7 kg)   SpO2 99%   BMI 18.97 kg/m   Body mass index is 18.97 kg/m. Wt Readings from Last 3 Encounters:  06/21/23 136 lb (61.7 kg)  12/09/22 138 lb 6.4 oz (62.8 kg)  04/22/22 137 lb (62.1 kg)   Gen: NAD, resting comfortably HEENT: TMs normal bilaterally. OP clear. No thyromegaly noted.  CV: RRR with no murmurs appreciated Pulm: NWOB, CTAB with no crackles, wheezes, or rhonchi GI: Normal bowel sounds present. Soft, Nontender, Nondistended. MSK: no edema, cyanosis, or clubbing noted Skin: warm, dry Neuro: CN2-12 grossly intact. Strength 5/5 in upper and lower extremities. Reflexes symmetric and intact bilaterally.  Psych: Normal affect and thought content     Lee Henderson M. Jimmey Ralph, MD 06/21/2023 11:28 AM

## 2023-06-21 NOTE — Patient Instructions (Signed)
It was very nice to see you today!  Keep up the great work!  Please continue to work on diet and exercise.  Return in about 1 year (around 06/20/2024) for Annual Physical.   Take care, Dr Jimmey Ralph  PLEASE NOTE:  If you had any lab tests, please let us know if you have not heard back within a few days. You may see your results on mychart before we have a chance to review them but we will give you a call once they are reviewed by [place].   If we ordered any referrals today, please let us know if you have not heard from their office within the next week.   If you had any urgent prescriptions sent in today, please check with the pharmacy within an hour of our visit to make sure the prescription was transmitted appropriately.   Please try these tips to maintain a healthy lifestyle:  Eat at least 3 REAL meals and 1-2 snacks per day.  Aim for no more than 5 hours between eating.  If you eat breakfast, please do so within one hour of getting up.   Each meal should contain half fruits/vegetables, one quarter protein, and one quarter carbs (no bigger than a computer mouse)  Cut down on sweet beverages. This includes juice, soda, and sweet tea.   Drink at least 1 glass of water with each meal and aim for at least 8 glasses per day  Exercise at least 150 minutes every week.    Preventive Care 59-63 Years Old, Male Preventive care refers to lifestyle choices and visits with your health care provider that can promote health and wellness. Preventive care visits are also called wellness exams. What can I expect for my preventive care visit? Counseling During your preventive care visit, your health care provider may ask about your: Medical history, including: Past medical problems. Family medical history. Current health, including: Emotional well-being. Home life and relationship well-being. Sexual activity. Lifestyle, including: Alcohol, nicotine or tobacco, and drug use. Access to  firearms. Diet, exercise, and sleep habits. Safety issues such as seatbelt and bike helmet use. Sunscreen use. Work and work Astronomer. Physical exam Your health care provider will check your: Height and weight. These may be used to calculate your BMI (body mass index). BMI is a measurement that tells if you are at a healthy weight. Waist circumference. This measures the distance around your waistline. This measurement also tells if you are at a healthy weight and may help predict your risk of certain diseases, such as type 2 diabetes and high blood pressure. Heart rate and blood pressure. Body temperature. Skin for abnormal spots. What immunizations do I need?  Vaccines are usually given at various ages, according to a schedule. Your health care provider will recommend vaccines for you based on your age, medical history, and lifestyle or other factors, such as travel or where you work. What tests do I need? Screening Your health care provider may recommend screening tests for certain conditions. This may include: Lipid and cholesterol levels. Diabetes screening. This is done by checking your blood sugar (glucose) after you have not eaten for a while (fasting). Hepatitis B test. Hepatitis C test. HIV (human immunodeficiency virus) test. STI (sexually transmitted infection) testing, if you are at risk. Lung cancer screening. Prostate cancer screening. Colorectal cancer screening. Talk with your health care provider about your test results, treatment options, and if necessary, the need for more tests. Follow these instructions at home: Eating and drinking  Eat a diet that includes fresh fruits and vegetables, whole grains, lean protein, and low-fat dairy products. Take vitamin and mineral supplements as recommended by your health care provider. Do not drink alcohol if your health care provider tells you not to drink. If you drink alcohol: Limit how much you have to 0-2 drinks a  day. Know how much alcohol is in your drink. In the [place] one drink equals one 12 oz bottle of beer (355 mL), one 5 oz glass of wine (148 mL), or one 1 oz glass of hard liquor (44 mL). Lifestyle Brush your teeth every morning and night with fluoride toothpaste. Floss one time each day. Exercise for at least 30 minutes 5 or more days each week. Do not use any products that contain nicotine or tobacco. These products include cigarettes, chewing tobacco, and vaping devices, such as e-cigarettes. If you need help quitting, ask your health care provider. Do not use drugs. If you are sexually active, practice safe sex. Use a condom or other form of protection to prevent STIs. Take aspirin only as told by your health care provider. Make sure that you understand how much to take and what form to take. Work with your health care provider to find out whether it is safe and beneficial for you to take aspirin daily. Find healthy ways to manage stress, such as: Meditation, yoga, or listening to music. Journaling. Talking to a trusted person. Spending time with friends and family. Minimize exposure to UV radiation to reduce your risk of skin cancer. Safety Always wear your seat belt while driving or riding in a vehicle. Do not drive: If you have been drinking alcohol. Do not ride with someone who has been drinking. When you are tired or distracted. While texting. If you have been using any mind-altering substances or drugs. Wear a helmet and other protective equipment during sports activities. If you have firearms in your house, make sure you follow all gun safety procedures. What's next? Go to your health care provider once a year for an annual wellness visit. Ask your health care provider how often you should have your eyes and teeth checked. Stay up to date on all vaccines. This information is not intended to replace advice given to you by your health care provider. Make sure you discuss any  questions you have with your health care provider. Document Revised: 03/04/2021 Document Reviewed: 03/04/2021 Elsevier Patient Education  2024 [employer].

## 2023-10-31 ENCOUNTER — Other Ambulatory Visit: Payer: Self-pay | Admitting: *Deleted

## 2023-10-31 DIAGNOSIS — I7121 Aneurysm of the ascending aorta, without rupture: Secondary | ICD-10-CM

## 2023-11-09 LAB — BASIC METABOLIC PANEL
BUN/Creatinine Ratio: 16 (ref 9–20)
BUN: 18 mg/dL (ref 6–24)
CO2: 24 mmol/L (ref 20–29)
Calcium: 9.3 mg/dL (ref 8.7–10.2)
Chloride: 103 mmol/L (ref 96–106)
Creatinine, Ser: 1.15 mg/dL (ref 0.76–1.27)
Glucose: 79 mg/dL (ref 70–99)
Potassium: 4.4 mmol/L (ref 3.5–5.2)
Sodium: 140 mmol/L (ref 134–144)
eGFR: 73 mL/min/{1.73_m2} (ref >59)

## 2023-12-05 ENCOUNTER — Ambulatory Visit (HOSPITAL_BASED_OUTPATIENT_CLINIC_OR_DEPARTMENT_OTHER)
Admission: RE | Admit: 2023-12-05 | Discharge: 2023-12-05 | Disposition: A | Discharge status: Home / Self Care | Payer: 59 | Source: Ambulatory Visit | Attending: Cardiology | Admitting: Cardiology

## 2023-12-05 DIAGNOSIS — R001 Bradycardia, unspecified: Secondary | ICD-10-CM | POA: Insufficient documentation

## 2023-12-05 DIAGNOSIS — I7121 Aneurysm of the ascending aorta, without rupture: Secondary | ICD-10-CM | POA: Insufficient documentation

## 2023-12-05 DIAGNOSIS — I10 Essential (primary) hypertension: Secondary | ICD-10-CM | POA: Insufficient documentation

## 2023-12-05 MED ORDER — IOHEXOL 350 MG/ML SOLN
100.0000 mL | Freq: Once | INTRAVENOUS | Status: AC | PRN
  Administered 2023-12-05: 80 mL via INTRAVENOUS

## 2023-12-06 ENCOUNTER — Encounter: Payer: Self-pay | Admitting: Cardiology

## 2023-12-15 NOTE — Telephone Encounter (Signed)
 Called and spoke to [facility] radiology  Radiology states note will be placed so report can be read

## 2024-05-16 ENCOUNTER — Encounter: Payer: Self-pay | Admitting: Cardiology

## 2024-05-22 NOTE — Progress Notes (Unsigned)
  Cardiology Office Note:   Date:  05/23/2024  ID:  Henderson Henderson, DOB [birth year], MRN [id number] PCP: Kennyth Worth HERO, MD  [facility] Providers Cardiologist:  Lynwood Schilling, MD {  History of Present Illness:   Henderson Henderson is a 58 y.o. male who is referred by Kennyth Worth HERO, MD for evaluation of a strong family history of CAD and new HTN.  In Feb he had a negative POET (Plain Old Exercise Treadmill).  The patient had a calcium score of 0.    He does have enlarged aortic root.   He returns for follow up.  He has recently hiked 1500 miles on the [place].  He is having a hard time keeping up his weight with all of this activity. The patient denies any new symptoms such as chest discomfort, neck or arm discomfort. There has been no new shortness of breath, PND or orthopnea. There have been no reported palpitations, presyncope or syncope.    ROS: As stated in the HPI and negative for all other systems.  Studies Reviewed:    EKG:   EKG Interpretation Date/Time:  Wednesday May 23 2024 10:06:21 EDT Ventricular Rate:  54 PR Interval:  160 QRS Duration:  86 QT Interval:  414 QTC Calculation: 392 R Axis:   73  Text Interpretation: Sinus bradycardia No significant change since last tracing Confirmed by Schilling Lynwood ([id number]) on 05/23/2024 10:23:35 AM    Risk Assessment/Calculations:              Physical Exam:   VS:  BP (!) 114/46 (BP Location: Right Arm, Patient Position: Sitting)   Pulse (!) 54   Ht 5' 11 (1.803 m)   Wt 137 lb 12.8 oz (62.5 kg)   SpO2 98%   BMI 19.22 kg/m    Wt Readings from Last 3 Encounters:  05/23/24 137 lb 12.8 oz (62.5 kg)  06/21/23 136 lb (61.7 kg)  12/09/22 138 lb 6.4 oz (62.8 kg)     GEN: Well nourished, well developed in no acute distress NECK: No JVD; No carotid bruits CARDIAC: RRR, no murmurs, rubs, gallops RESPIRATORY:  Clear to auscultation without rales, wheezing or rhonchi  ABDOMEN: Soft, non-tender,  non-distended EXTREMITIES:  No edema; No deformity   ASSESSMENT AND PLAN:   HTN:   His blood pressure is actually running low.  He has stopped his chlorthalidone  and he is can keep a blood pressure diary.  He will remain on the amlodipine .    ASCENDING AORTIC DILATATION: He had a 40 mm in March 2025.  The plan was to follow this in two years.   BRADYCARDIA:     He has good vagal tone to explain this.  No therapy is indicated.      Follow up with me in a couple of years.  Signed, Lynwood Schilling, MD

## 2024-05-23 ENCOUNTER — Ambulatory Visit: Attending: Cardiology | Admitting: Cardiology

## 2024-05-23 ENCOUNTER — Encounter: Payer: Self-pay | Admitting: Cardiology

## 2024-05-23 VITALS — BP 114/46 | HR 54 | Ht 71.0 in | Wt 137.8 lb

## 2024-05-23 DIAGNOSIS — R001 Bradycardia, unspecified: Secondary | ICD-10-CM | POA: Diagnosis not present

## 2024-05-23 DIAGNOSIS — I7121 Aneurysm of the ascending aorta, without rupture: Secondary | ICD-10-CM | POA: Diagnosis not present

## 2024-05-23 DIAGNOSIS — I10 Essential (primary) hypertension: Secondary | ICD-10-CM | POA: Diagnosis not present

## 2024-05-23 NOTE — Patient Instructions (Signed)
 Medication Instructions:  Stop Chlorthalidone  *If you need a refill on your cardiac medications before your next appointment, please call your pharmacy*  Lab Work: NONE If you have labs (blood work) drawn today and your tests are completely normal, you will receive your results only by: MyChart Message (if you have MyChart) OR A paper copy in the mail If you have any lab test that is abnormal or we need to change your treatment, we will call you to review the results.  Testing/Procedures: CT of Aorta in Aug 2027  Follow-Up: At [facility], you and your health needs are our priority.  As part of our continuing mission to provide you with exceptional heart care, our providers are all part of one team.  This team includes your primary Cardiologist (physician) and Advanced Practice Providers or APPs (Physician Assistants and Nurse Practitioners) who all work together to provide you with the care you need, when you need it.  Your next appointment:   2 years  Provider:   [place], MD  We recommend signing up for the patient portal called MyChart.  Sign up information is provided on this After Visit Summary.  MyChart is used to connect with patients for Virtual Visits (Telemedicine).  Patients are able to view lab/test results, encounter notes, upcoming appointments, etc.  Non-urgent messages can be sent to your provider as well.   To learn more about what you can do with MyChart, go to [email or web address].   Other Instructions Blood pressure diary: Take your blood pressure twice daily for 10 days and send us  the readings via MyChart

## 2024-06-26 ENCOUNTER — Encounter: Payer: 59 | Admitting: Family Medicine
